# Patient Record
Sex: Female | Born: 1980 | Race: White | Hispanic: No | Marital: Single | Smoking: Former smoker
Health system: Southern US, Community
[De-identification: ages and names within clinical notes are randomized; demographics above are authoritative.]

## PROBLEM LIST (undated history)

## (undated) DIAGNOSIS — R7309 Other abnormal glucose: Secondary | ICD-10-CM

## (undated) DIAGNOSIS — K760 Fatty (change of) liver, not elsewhere classified: Secondary | ICD-10-CM

## (undated) DIAGNOSIS — N946 Dysmenorrhea, unspecified: Secondary | ICD-10-CM

## (undated) DIAGNOSIS — F32A Depression, unspecified: Secondary | ICD-10-CM

## (undated) DIAGNOSIS — E039 Hypothyroidism, unspecified: Secondary | ICD-10-CM

## (undated) DIAGNOSIS — L680 Hirsutism: Secondary | ICD-10-CM

## (undated) DIAGNOSIS — F329 Major depressive disorder, single episode, unspecified: Secondary | ICD-10-CM

## (undated) DIAGNOSIS — R51 Headache: Secondary | ICD-10-CM

## (undated) DIAGNOSIS — E282 Polycystic ovarian syndrome: Secondary | ICD-10-CM

## (undated) DIAGNOSIS — F419 Anxiety disorder, unspecified: Secondary | ICD-10-CM

## (undated) DIAGNOSIS — N939 Abnormal uterine and vaginal bleeding, unspecified: Secondary | ICD-10-CM

## (undated) DIAGNOSIS — Q51818 Other congenital malformations of uterus: Secondary | ICD-10-CM

## (undated) DIAGNOSIS — R519 Headache, unspecified: Secondary | ICD-10-CM

## (undated) HISTORY — DX: Abnormal uterine and vaginal bleeding, unspecified: N93.9

## (undated) HISTORY — DX: Other congenital malformations of uterus: Q51.818

## (undated) HISTORY — PX: NO PAST SURGERIES: SHX2092

## (undated) HISTORY — DX: Dysmenorrhea, unspecified: N94.6

## (undated) HISTORY — DX: Other abnormal glucose: R73.09

## (undated) HISTORY — DX: Hirsutism: L68.0

---

## 2017-11-21 ENCOUNTER — Emergency Department (HOSPITAL_COMMUNITY)
Admission: EM | Admit: 2017-11-21 | Discharge: 2017-11-22 | Disposition: A | Discharge status: Home / Self Care | Payer: Self-pay | Attending: Emergency Medicine | Admitting: Emergency Medicine

## 2017-11-21 ENCOUNTER — Emergency Department (HOSPITAL_COMMUNITY): Payer: Self-pay

## 2017-11-21 ENCOUNTER — Encounter (HOSPITAL_COMMUNITY): Payer: Self-pay | Admitting: Emergency Medicine

## 2017-11-21 DIAGNOSIS — Y929 Unspecified place or not applicable: Secondary | ICD-10-CM | POA: Insufficient documentation

## 2017-11-21 DIAGNOSIS — S8252XA Displaced fracture of medial malleolus of left tibia, initial encounter for closed fracture: Secondary | ICD-10-CM | POA: Insufficient documentation

## 2017-11-21 DIAGNOSIS — R109 Unspecified abdominal pain: Secondary | ICD-10-CM | POA: Insufficient documentation

## 2017-11-21 DIAGNOSIS — R079 Chest pain, unspecified: Secondary | ICD-10-CM | POA: Insufficient documentation

## 2017-11-21 DIAGNOSIS — Q7292 Unspecified reduction defect of left lower limb: Secondary | ICD-10-CM

## 2017-11-21 DIAGNOSIS — Y999 Unspecified external cause status: Secondary | ICD-10-CM | POA: Insufficient documentation

## 2017-11-21 DIAGNOSIS — R102 Pelvic and perineal pain: Secondary | ICD-10-CM | POA: Insufficient documentation

## 2017-11-21 DIAGNOSIS — S82402A Unspecified fracture of shaft of left fibula, initial encounter for closed fracture: Secondary | ICD-10-CM | POA: Insufficient documentation

## 2017-11-21 DIAGNOSIS — Y939 Activity, unspecified: Secondary | ICD-10-CM | POA: Insufficient documentation

## 2017-11-21 DIAGNOSIS — S0990XA Unspecified injury of head, initial encounter: Secondary | ICD-10-CM | POA: Insufficient documentation

## 2017-11-21 DIAGNOSIS — S82892A Other fracture of left lower leg, initial encounter for closed fracture: Secondary | ICD-10-CM

## 2017-11-21 LAB — BASIC METABOLIC PANEL
ANION GAP: 13 (ref 5–15)
BUN: 9 mg/dL (ref 6–20)
CO2: 20 mmol/L — ABNORMAL LOW (ref 22–32)
CREATININE: 0.66 mg/dL (ref 0.44–1.00)
Calcium: 9.6 mg/dL (ref 8.9–10.3)
Chloride: 105 mmol/L (ref 98–111)
Glucose, Bld: 117 mg/dL — ABNORMAL HIGH (ref 70–99)
Potassium: 3.5 mmol/L (ref 3.5–5.1)
Sodium: 138 mmol/L (ref 135–145)

## 2017-11-21 LAB — CBC
HEMATOCRIT: 41.3 % (ref 36.0–46.0)
Hemoglobin: 12.8 g/dL (ref 12.0–15.0)
MCH: 27.3 pg (ref 26.0–34.0)
MCHC: 31 g/dL (ref 30.0–36.0)
MCV: 88.1 fL (ref 78.0–100.0)
PLATELETS: 335 10*3/uL (ref 150–400)
RBC: 4.69 MIL/uL (ref 3.87–5.11)
RDW: 13.8 % (ref 11.5–15.5)
WBC: 12.2 10*3/uL — ABNORMAL HIGH (ref 4.0–10.5)

## 2017-11-21 LAB — I-STAT BETA HCG BLOOD, ED (MC, WL, AP ONLY): I-stat hCG, quantitative: <5 m[IU]/mL (ref <5)

## 2017-11-21 MED ORDER — TETANUS-DIPHTH-ACELL PERTUSSIS 5-2.5-18.5 LF-MCG/0.5 IM SUSP
0.5000 mL | Freq: Once | INTRAMUSCULAR | Status: AC
  Administered 2017-11-21: 0.5 mL via INTRAMUSCULAR
  Filled 2017-11-21: qty 0.5

## 2017-11-21 MED ORDER — SODIUM CHLORIDE 0.9 % IV BOLUS
1000.0000 mL | Freq: Once | INTRAVENOUS | Status: AC
  Administered 2017-11-21: 1000 mL via INTRAVENOUS

## 2017-11-21 MED ORDER — HYDROMORPHONE HCL 1 MG/ML IJ SOLN
1.0000 mg | Freq: Once | INTRAMUSCULAR | Status: AC
  Administered 2017-11-21: 1 mg via INTRAVENOUS
  Filled 2017-11-21: qty 1

## 2017-11-21 MED ORDER — IOHEXOL 300 MG/ML  SOLN
100.0000 mL | Freq: Once | INTRAMUSCULAR | Status: AC | PRN
  Administered 2017-11-21: 100 mL via INTRAVENOUS

## 2017-11-21 NOTE — ED Notes (Signed)
Pt back from imaging

## 2017-11-21 NOTE — ED Notes (Signed)
Patient transported to CT 
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
## 2017-11-21 NOTE — ED Notes (Signed)
Pt back from CT

## 2017-11-21 NOTE — ED Notes (Signed)
Pt transported to imaging.

## 2017-11-22 ENCOUNTER — Emergency Department (HOSPITAL_COMMUNITY): Payer: Self-pay

## 2017-11-22 MED ORDER — PROPOFOL 10 MG/ML IV BOLUS
0.5000 mg/kg | Freq: Once | INTRAVENOUS | Status: DC
  Filled 2017-11-22: qty 20

## 2017-11-22 MED ORDER — KETAMINE HCL 10 MG/ML IJ SOLN
1.0000 mg/kg | Freq: Once | INTRAMUSCULAR | Status: DC
  Filled 2017-11-22: qty 1

## 2017-11-22 MED ORDER — PROPOFOL 10 MG/ML IV BOLUS
INTRAVENOUS | Status: AC | PRN
  Administered 2017-11-22: 25 mg via INTRAVENOUS

## 2017-11-22 MED ORDER — HYDROCODONE-ACETAMINOPHEN 5-325 MG PO TABS: 1.0000 | ORAL_TABLET | Freq: Four times a day (QID) | ORAL | 0 refills | Status: DC | PRN

## 2017-11-22 MED ORDER — KETAMINE HCL 10 MG/ML IJ SOLN
INTRAMUSCULAR | Status: AC | PRN
  Administered 2017-11-22: 50 mg via INTRAVENOUS
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
## 2017-11-22 NOTE — ED Provider Notes (Signed)
  Physical Exam  BP 126/73   Pulse 99   Temp 97.8 F (36.6 C) (Oral)   Resp (!) 21   Ht 5\' 2"  (1.575 m)   Wt 113.4 kg (250 lb)   LMP 10/22/2017 (Approximate)   SpO2 94%   BMI 45.73 kg/m   Physical Exam  Deformed left ankle.  Neurovascularly intact.  Patient is morbidly obese but otherwise no risk factors for sedation.  ED Course/Procedures     .Sedation Date/Time: 11/22/2017 5:02 AM Performed by: Merryl Hacker, MD Authorized by: Merryl Hacker, MD   Consent:    Consent obtained:  Verbal   Consent given by:  Patient   Risks discussed:  Allergic reaction, dysrhythmia, inadequate sedation, nausea, prolonged hypoxia resulting in organ damage, prolonged sedation necessitating reversal, respiratory compromise necessitating ventilatory assistance and intubation and vomiting   Alternatives discussed:  Analgesia without sedation, anxiolysis and regional anesthesia Universal protocol:    Procedure explained and questions answered to patient or proxy's satisfaction: yes     Relevant documents present and verified: yes     Test results available and properly labeled: yes     Imaging studies available: yes     Required blood products, implants, devices, and special equipment available: yes     Site/side marked: yes     Immediately prior to procedure a time out was called: yes     Patient identity confirmation method:  Verbally with patient Indications:    Procedure performed:  Fracture reduction   Procedure necessitating sedation performed by:  Physician performing sedation   Intended level of sedation:  Moderate (conscious sedation) Pre-sedation assessment:    Time since last food or drink:  >4 hrs   ASA classification: class 1 - normal, healthy patient     Neck mobility: normal     Mouth opening:  3 or more finger widths   Thyromental distance:  4 finger widths   Mallampati score:  I - soft palate, uvula, fauces, pillars visible   Pre-sedation assessments completed and  reviewed: airway patency, cardiovascular function, hydration status, mental status, nausea/vomiting, pain level, respiratory function and temperature   Immediate pre-procedure details:    Reassessment: Patient reassessed immediately prior to procedure     Reviewed: vital signs, relevant labs/tests and NPO status     Verified: bag valve mask available, emergency equipment available, intubation equipment available, IV patency confirmed, oxygen available and suction available   Procedure details (see MAR for exact dosages):    Preoxygenation:  Nasal cannula   Sedation:  Propofol and ketamine   Intra-procedure monitoring:  Blood pressure monitoring, cardiac monitor, continuous pulse oximetry, frequent LOC assessments, frequent vital sign checks and continuous capnometry   Intra-procedure events: none     Total Provider sedation time (minutes):  20 Post-procedure details:    Attendance: Constant attendance by certified staff until patient recovered     Recovery: Patient returned to pre-procedure baseline     Post-sedation assessments completed and reviewed: airway patency, cardiovascular function, hydration status, mental status, nausea/vomiting, pain level, respiratory function and temperature     Patient is stable for discharge or admission: yes     Patient tolerance:  Tolerated well, no immediate complications    MDM   Additional note for details       Merryl Hacker, MD 11/22/17 [phone]

## 2017-11-23 ENCOUNTER — Encounter (INDEPENDENT_AMBULATORY_CARE_PROVIDER_SITE_OTHER): Payer: Self-pay | Admitting: Orthopaedic Surgery

## 2017-11-24 ENCOUNTER — Ambulatory Visit (INDEPENDENT_AMBULATORY_CARE_PROVIDER_SITE_OTHER): Payer: Self-pay | Admitting: Orthopedic Surgery

## 2017-11-24 ENCOUNTER — Other Ambulatory Visit: Payer: Self-pay

## 2017-11-24 ENCOUNTER — Encounter (HOSPITAL_COMMUNITY): Payer: Self-pay | Admitting: *Deleted

## 2017-11-24 ENCOUNTER — Other Ambulatory Visit (INDEPENDENT_AMBULATORY_CARE_PROVIDER_SITE_OTHER): Payer: Self-pay | Admitting: Orthopedic Surgery

## 2017-11-24 ENCOUNTER — Encounter (INDEPENDENT_AMBULATORY_CARE_PROVIDER_SITE_OTHER): Payer: Self-pay | Admitting: Orthopedic Surgery

## 2017-11-24 ENCOUNTER — Ambulatory Visit (HOSPITAL_COMMUNITY)
Admission: EM | Admit: 2017-11-24 | Discharge: 2017-11-24 | Disposition: A | Discharge status: Other Acute Inpatient Hospital | Payer: Self-pay | Attending: Family Medicine | Admitting: Family Medicine

## 2017-11-24 DIAGNOSIS — S82892A Other fracture of left lower leg, initial encounter for closed fracture: Secondary | ICD-10-CM

## 2017-11-24 DIAGNOSIS — S82842A Displaced bimalleolar fracture of left lower leg, initial encounter for closed fracture: Secondary | ICD-10-CM

## 2017-11-24 DIAGNOSIS — Z6841 Body Mass Index (BMI) 40.0 and over, adult: Secondary | ICD-10-CM | POA: Insufficient documentation

## 2017-11-24 DIAGNOSIS — S82892D Other fracture of left lower leg, subsequent encounter for closed fracture with routine healing: Secondary | ICD-10-CM

## 2017-11-24 MED ORDER — HYDROCODONE-ACETAMINOPHEN 5-325 MG PO TABS: 1.0000 | ORAL_TABLET | ORAL | 0 refills | Status: DC | PRN
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
## 2017-11-24 NOTE — ED Notes (Signed)
[facility] called and  Dr. Sharol Given wants to see patient in his office now. Dr. Meda Coffee aware.

## 2017-11-24 NOTE — Discharge Instructions (Addendum)
While performing examination Dr. Sharol Given from [facility] orthopedics called and requested for patient to be seen in his office for follow up.  Patient aware and in agreement with this plan.  Leaving by private vehicle to Dr. Jess Barters office with caregiver.

## 2017-11-24 NOTE — Progress Notes (Signed)
Spoke with pt for pre-op call. Pt denies cardiac history, HTN or Diabetes. 
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
## 2017-11-24 NOTE — Telephone Encounter (Signed)
Patient came in and saw Dr. Sharol Given this afternoon. Questions were answered. She is having surgery tomorrow.

## 2017-11-25 ENCOUNTER — Ambulatory Visit (HOSPITAL_COMMUNITY): Payer: Self-pay | Admitting: Anesthesiology

## 2017-11-25 ENCOUNTER — Encounter (HOSPITAL_COMMUNITY): Payer: Self-pay | Admitting: *Deleted

## 2017-11-25 ENCOUNTER — Ambulatory Visit (HOSPITAL_COMMUNITY)
Admission: RE | Admit: 2017-11-25 | Discharge: 2017-11-25 | Disposition: A | Discharge status: Home / Self Care | Payer: Self-pay | Source: Ambulatory Visit | Attending: Orthopedic Surgery | Admitting: Orthopedic Surgery

## 2017-11-25 ENCOUNTER — Encounter (HOSPITAL_COMMUNITY)
Admission: RE | Disposition: A | Discharge status: Home / Self Care | Payer: Self-pay | Source: Ambulatory Visit | Attending: Orthopedic Surgery

## 2017-11-25 DIAGNOSIS — Z87891 Personal history of nicotine dependence: Secondary | ICD-10-CM | POA: Insufficient documentation

## 2017-11-25 DIAGNOSIS — Z6841 Body Mass Index (BMI) 40.0 and over, adult: Secondary | ICD-10-CM | POA: Insufficient documentation

## 2017-11-25 DIAGNOSIS — Z8249 Family history of ischemic heart disease and other diseases of the circulatory system: Secondary | ICD-10-CM | POA: Insufficient documentation

## 2017-11-25 DIAGNOSIS — S93439A Sprain of tibiofibular ligament of unspecified ankle, initial encounter: Secondary | ICD-10-CM

## 2017-11-25 DIAGNOSIS — F419 Anxiety disorder, unspecified: Secondary | ICD-10-CM | POA: Insufficient documentation

## 2017-11-25 DIAGNOSIS — F329 Major depressive disorder, single episode, unspecified: Secondary | ICD-10-CM | POA: Insufficient documentation

## 2017-11-25 DIAGNOSIS — K760 Fatty (change of) liver, not elsewhere classified: Secondary | ICD-10-CM | POA: Insufficient documentation

## 2017-11-25 DIAGNOSIS — S82892A Other fracture of left lower leg, initial encounter for closed fracture: Secondary | ICD-10-CM

## 2017-11-25 DIAGNOSIS — S82842A Displaced bimalleolar fracture of left lower leg, initial encounter for closed fracture: Secondary | ICD-10-CM

## 2017-11-25 DIAGNOSIS — S93432A Sprain of tibiofibular ligament of left ankle, initial encounter: Secondary | ICD-10-CM | POA: Insufficient documentation

## 2017-11-25 HISTORY — DX: Fatty (change of) liver, not elsewhere classified: K76.0

## 2017-11-25 HISTORY — DX: Depression, unspecified: F32.A

## 2017-11-25 HISTORY — DX: Headache, unspecified: R51.9

## 2017-11-25 HISTORY — DX: Major depressive disorder, single episode, unspecified: F32.9

## 2017-11-25 HISTORY — PX: ORIF ANKLE FRACTURE: SHX5408

## 2017-11-25 HISTORY — DX: Anxiety disorder, unspecified: F41.9

## 2017-11-25 HISTORY — DX: Headache: R51

## 2017-11-25 SURGERY — OPEN REDUCTION INTERNAL FIXATION (ORIF) ANKLE FRACTURE
Anesthesia: General | Site: Leg Lower | Laterality: Left

## 2017-11-25 MED ORDER — 0.9 % SODIUM CHLORIDE (POUR BTL) OPTIME
TOPICAL | Status: DC | PRN
  Administered 2017-11-25: 1000 mL

## 2017-11-25 MED ORDER — FENTANYL CITRATE (PF) 100 MCG/2ML IJ SOLN
INTRAMUSCULAR | Status: DC | PRN
  Administered 2017-11-25 (×3): 50 ug via INTRAVENOUS

## 2017-11-25 MED ORDER — ONDANSETRON HCL 4 MG/2ML IJ SOLN
INTRAMUSCULAR | Status: AC
  Filled 2017-11-25: qty 2

## 2017-11-25 MED ORDER — DEXAMETHASONE SODIUM PHOSPHATE 10 MG/ML IJ SOLN
INTRAMUSCULAR | Status: DC | PRN
  Administered 2017-11-25: 10 mg via INTRAVENOUS

## 2017-11-25 MED ORDER — PROPOFOL 10 MG/ML IV BOLUS
INTRAVENOUS | Status: DC | PRN
  Administered 2017-11-25: 200 mg via INTRAVENOUS

## 2017-11-25 MED ORDER — PROPOFOL 10 MG/ML IV BOLUS
INTRAVENOUS | Status: AC
  Filled 2017-11-25: qty 20

## 2017-11-25 MED ORDER — HYDROMORPHONE HCL 1 MG/ML IJ SOLN: 0.2500 mg | INTRAMUSCULAR | Status: DC | PRN

## 2017-11-25 MED ORDER — DEXAMETHASONE SODIUM PHOSPHATE 10 MG/ML IJ SOLN
INTRAMUSCULAR | Status: AC
  Filled 2017-11-25: qty 1

## 2017-11-25 MED ORDER — PHENYLEPHRINE 40 MCG/ML (10ML) SYRINGE FOR IV PUSH (FOR BLOOD PRESSURE SUPPORT)
PREFILLED_SYRINGE | INTRAVENOUS | Status: AC
  Filled 2017-11-25: qty 10

## 2017-11-25 MED ORDER — FENTANYL CITRATE (PF) 250 MCG/5ML IJ SOLN
INTRAMUSCULAR | Status: AC
  Filled 2017-11-25: qty 5

## 2017-11-25 MED ORDER — MIDAZOLAM HCL 2 MG/2ML IJ SOLN
INTRAMUSCULAR | Status: AC
  Filled 2017-11-25: qty 2

## 2017-11-25 MED ORDER — CEFAZOLIN SODIUM-DEXTROSE 2-4 GM/100ML-% IV SOLN
2.0000 g | INTRAVENOUS | Status: AC
  Administered 2017-11-25: 3 g via INTRAVENOUS

## 2017-11-25 MED ORDER — BUPIVACAINE-EPINEPHRINE (PF) 0.5% -1:200000 IJ SOLN
INTRAMUSCULAR | Status: DC | PRN
  Administered 2017-11-25: 30 mL via PERINEURAL

## 2017-11-25 MED ORDER — EPHEDRINE SULFATE 50 MG/ML IJ SOLN
INTRAMUSCULAR | Status: AC
  Filled 2017-11-25: qty 1

## 2017-11-25 MED ORDER — CHLORHEXIDINE GLUCONATE 4 % EX LIQD: 60.0000 mL | Freq: Once | CUTANEOUS | Status: DC

## 2017-11-25 MED ORDER — BUPIVACAINE HCL (PF) 0.5 % IJ SOLN
INTRAMUSCULAR | Status: DC | PRN
  Administered 2017-11-25: 10 mL

## 2017-11-25 MED ORDER — FENTANYL CITRATE (PF) 250 MCG/5ML IJ SOLN
INTRAMUSCULAR | Status: AC
Start: 2017-11-25 — End: ?
  Filled 2017-11-25: qty 5

## 2017-11-25 MED ORDER — ONDANSETRON HCL 4 MG/2ML IJ SOLN
INTRAMUSCULAR | Status: DC | PRN
  Administered 2017-11-25: 4 mg via INTRAVENOUS

## 2017-11-25 MED ORDER — LACTATED RINGERS IV SOLN
INTRAVENOUS | Status: DC
  Administered 2017-11-25: 08:00:00 via INTRAVENOUS

## 2017-11-25 MED ORDER — MIDAZOLAM HCL 2 MG/2ML IJ SOLN
INTRAMUSCULAR | Status: DC | PRN
  Administered 2017-11-25: 2 mg via INTRAVENOUS

## 2017-11-25 MED ORDER — LIDOCAINE 2% (20 MG/ML) 5 ML SYRINGE
INTRAMUSCULAR | Status: AC
  Filled 2017-11-25: qty 5

## 2017-11-25 SURGICAL SUPPLY — 50 items
BANDAGE ESMARK 6X9 LF (GAUZE/BANDAGES/DRESSINGS) ×1 IMPLANT
BENZOIN TINCTURE PRP APPL 2/3 (GAUZE/BANDAGES/DRESSINGS) ×2 IMPLANT
BIT DRILL 2.5X110 QC LCP DISP (BIT) ×2 IMPLANT
BNDG COHESIVE 4X5 TAN STRL (GAUZE/BANDAGES/DRESSINGS) ×2 IMPLANT
BNDG ESMARK 6X9 LF (GAUZE/BANDAGES/DRESSINGS) ×2
BNDG GAUZE ELAST 4 BULKY (GAUZE/BANDAGES/DRESSINGS) IMPLANT
COVER SURGICAL LIGHT HANDLE (MISCELLANEOUS) ×2 IMPLANT
DRAPE INCISE IOBAN 85X60 (DRAPES) ×4 IMPLANT
DRAPE OEC MINIVIEW 54X84 (DRAPES) ×2 IMPLANT
DRAPE U-SHAPE 47X51 STRL (DRAPES) ×2 IMPLANT
DRESSING PREVENA PLUS CUSTOM (GAUZE/BANDAGES/DRESSINGS) ×1 IMPLANT
DRSG ADAPTIC 3X8 NADH LF (GAUZE/BANDAGES/DRESSINGS) IMPLANT
DRSG PAD ABDOMINAL 8X10 ST (GAUZE/BANDAGES/DRESSINGS) IMPLANT
DRSG PREVENA PLUS CUSTOM (GAUZE/BANDAGES/DRESSINGS) ×2
DURAPREP 26ML APPLICATOR (WOUND CARE) ×2 IMPLANT
ELECT REM PT RETURN 9FT ADLT (ELECTROSURGICAL) ×2
ELECTRODE REM PT RTRN 9FT ADLT (ELECTROSURGICAL) ×1 IMPLANT
GAUZE SPONGE 4X4 12PLY STRL (GAUZE/BANDAGES/DRESSINGS) IMPLANT
GLOVE BIOGEL PI IND STRL 9 (GLOVE) ×1 IMPLANT
GLOVE BIOGEL PI INDICATOR 9 (GLOVE) ×1
GLOVE SURG ORTHO 9.0 STRL STRW (GLOVE) ×2 IMPLANT
GOWN STRL REUS W/ TWL XL LVL3 (GOWN DISPOSABLE) ×2 IMPLANT
GOWN STRL REUS W/TWL XL LVL3 (GOWN DISPOSABLE) ×2
K-WIRE 1.25 TRCR POINT 150 (WIRE) ×4
KIT BASIN OR (CUSTOM PROCEDURE TRAY) ×2 IMPLANT
KIT TURNOVER KIT B (KITS) ×2 IMPLANT
KWIRE 1.25 TRCR POINT 150 (WIRE) ×2 IMPLANT
MANIFOLD NEPTUNE II (INSTRUMENTS) IMPLANT
NS IRRIG 1000ML POUR BTL (IV SOLUTION) ×2 IMPLANT
PACK ORTHO EXTREMITY (CUSTOM PROCEDURE TRAY) ×2 IMPLANT
PAD ARMBOARD 7.5X6 YLW CONV (MISCELLANEOUS) ×4 IMPLANT
PLATE LCP 3.5 1/3 TUB 8HX93 (Plate) ×2 IMPLANT
SCREW CANN S THRD/44 4.0 (Screw) ×4 IMPLANT
SCREW CORTEX 3.5 12MM (Screw) ×3 IMPLANT
SCREW CORTEX 3.5 14MM (Screw) ×1 IMPLANT
SCREW CORTEX 3.5 40MM (Screw) ×1 IMPLANT
SCREW LOCK CORT ST 3.5X12 (Screw) ×3 IMPLANT
SCREW LOCK CORT ST 3.5X14 (Screw) ×1 IMPLANT
SCREW LOCK CORT ST 3.5X40 (Screw) ×1 IMPLANT
SCREW LOCK T15 FT 14X3.5X2.9X (Screw) ×1 IMPLANT
SCREW LOCKING 3.5X14 (Screw) ×1 IMPLANT
STAPLER VISISTAT 35W (STAPLE) IMPLANT
SUCTION FRAZIER HANDLE 10FR (MISCELLANEOUS) ×1
SUCTION TUBE FRAZIER 10FR DISP (MISCELLANEOUS) ×1 IMPLANT
SUT ETHILON 2 0 PSLX (SUTURE) ×8 IMPLANT
SUT VIC AB 2-0 CT1 27 (SUTURE) ×1
SUT VIC AB 2-0 CT1 TAPERPNT 27 (SUTURE) ×1 IMPLANT
TOWEL OR 17X24 6PK STRL BLUE (TOWEL DISPOSABLE) ×2 IMPLANT
TOWEL OR 17X26 10 PK STRL BLUE (TOWEL DISPOSABLE) ×2 IMPLANT
TUBE CONNECTING 12X1/4 (SUCTIONS) ×2 IMPLANT

## 2017-11-25 NOTE — Anesthesia Postprocedure Evaluation (Signed)
Anesthesia Post Note  Patient: Nature conservation officer  Procedure(s) Performed: OPEN REDUCTION INTERNAL FIXATION (ORIF) LEFT ANKLE FRACTURE (Left Leg Lower)     Patient location during evaluation: PACU Anesthesia Type: General and Regional Level of consciousness: awake and alert Pain management: pain level controlled Vital Signs Assessment: post-procedure vital signs reviewed and stable Respiratory status: spontaneous breathing, nonlabored ventilation and respiratory function stable Cardiovascular status: blood pressure returned to baseline and stable Postop Assessment: no apparent nausea or vomiting Anesthetic complications: no    Last Vitals:  Vitals:   11/25/17 1145 11/25/17 1200  BP: 103/60 116/75  Pulse: 78 82  Resp: 15 16  Temp:  36.6 C  SpO2: 97% 97%    Last Pain:  Vitals:   11/25/17 1200  TempSrc:   PainSc: 0-No pain                 Cinch Ormond,W. EDMOND

## 2017-11-25 NOTE — Anesthesia Procedure Notes (Signed)
Anesthesia Regional Block: Popliteal block   Pre-Anesthetic Checklist: ,, timeout performed, Correct Patient, Correct Site, Correct Laterality, Correct Procedure, Correct Position, site marked, Risks and benefits discussed, pre-op evaluation,  At surgeon's request and post-op pain management  Laterality: Left  Prep: Maximum Sterile Barrier Precautions used, chloraprep       Needles:  Injection technique: Single-shot  Needle Type: Echogenic Stimulator Needle     Needle Length: 9cm  Needle Gauge: 21     Additional Needles:   Procedures:, nerve stimulator,,, ultrasound used (permanent image in chart),,,,   Nerve Stimulator or Paresthesia:  Response: Peroneal,  Response: Tibial,   Additional Responses:   Narrative:  Start time: 11/25/2017 9:05 AM End time: 11/25/2017 9:20 AM Injection made incrementally with aspirations every 5 mL. Anesthesiologist: Roderic Palau, MD  Additional Notes: 2% Lidocaine skin wheel. Saphenous block with 10cc of 0.5% Bupivicaine plain.
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
## 2017-11-25 NOTE — Op Note (Signed)
11/25/2017  11:07 AM  PATIENT:  Patricia Rios    PRE-OPERATIVE DIAGNOSIS:  Bimalleolar Left Ankle Fracture with syndesmotic disruption  POST-OPERATIVE DIAGNOSIS:  Same  PROCEDURE:  OPEN REDUCTION INTERNAL FIXATION (ORIF) LEFT ANKLE FRACTURE, with ORIF fibula and medial malleolus and internal fixation of the syndesmosis. Application of incisional wound VAC. C arm fluoroscopy.  SURGEON:  Newt Minion, MD  PHYSICIAN ASSISTANT:None ANESTHESIA:   General  PREOPERATIVE INDICATIONS:  Patricia Rios is a  37 y.o. female with a diagnosis of Bimalleolar Left Ankle Fracture who failed conservative measures and elected for surgical management.    The risks benefits and alternatives were discussed with the patient preoperatively including but not limited to the risks of infection, bleeding, nerve injury, cardiopulmonary complications, the need for revision surgery, among others, and the patient was willing to proceed.  OPERATIVE IMPLANTS: 8 hole one third tubular plate with associated internal fixation and 2 cannulated screws for the medial malleolus 140 mm screw for the syndesmotic repair  @ENCIMAGES @  OPERATIVE FINDINGS: Syndesmotic disruption with partial injury to the posterior tibial tendon.  OPERATIVE PROCEDURE: Patient was brought to the operating room and underwent a general anesthetic.  After adequate levels of anesthesia were obtained patient's left lower extremity was prepped using DuraPrep draped in the sterile field a timeout was called.  Patient received a popliteal block preoperatively.  A lateral incision was made over the fibula.  This was carried sharply down to bone.  The fracture margins were freshened and irrigated with normal saline.  There was a large segmental piece and repair and reduction of the fracture was obtained with use of the hardware.  The plate was secured proximally to the fibula 8 hole plate.  The fibula was then sought out to length secured to the plate and a locking  and compression screw were placed distally.  3 compression screws were placed proximally and a syndesmotic screw was placed under C-arm fluoroscopy to repair the syndesmosis.  With the large segmental fragment the soft tissue attachment was not disrupted to maintain viability of the retained fragment.  The wound was irrigated with normal saline incision was closed using 2-0 nylon.  C-arm fluoroscopy verified the fibula was out to length and verified reduction and rotation.  Attention was then focused on the medial malleolus.  A medial incision was made this was carried sharply down to bone.  The soft tissue was cleansed from the fracture site.  The fracture site extended into the joint and the joint was irrigated and debrided.  Examination of the posterior tibial tendon showed some partial injury however over 90% of the posterior tibial tendon was intact.C-arm fluoroscopy verified alignment  The medial malleolus was reduced and stabilized with 2 K wires.  2 cannulated screws were placed over the wires plan.  C-arm fluoroscopy verified a congruent mortise.  The wound was again irrigated incision closed using 2-0 nylon.  A sterile customizable Praveena dressing was applied this had a good suction fit.  Patient was extubated taken the PACU in stable condition.   DISCHARGE PLANNING:  Antibiotic duration: 3 g preoperatively  Weightbearing: Nonweightbearing on the left fracture boot crutches  Pain medication: Vicodin  Dressing care/ Wound VAC: Continue wound VAC for 1 week  Ambulatory devices: Crutches or kneeling scooter.  Discharge to: Home.  Follow-up: In the office 1 week post operative.

## 2017-11-25 NOTE — Progress Notes (Signed)
Orthopedic Tech Progress Note Patient Details:  Patricia Rios 08/08/1980 [id number]  Ortho Devices Type of Ortho Device: CAM walker Ortho Device/Splint Location: lle Ortho Device/Splint Interventions: Application   Post Interventions Patient Tolerated: Well Instructions Provided: Care of device   Hildred Priest 11/25/2017, 11:28 AM

## 2017-11-25 NOTE — Anesthesia Procedure Notes (Signed)
Procedure Name: LMA Insertion Date/Time: 11/25/2017 9:49 AM Performed by: Inda Coke, CRNA Pre-anesthesia Checklist: Patient identified, Emergency Drugs available, Suction available and Patient being monitored Patient Re-evaluated:Patient Re-evaluated prior to induction Oxygen Delivery Method: Circle System Utilized Preoxygenation: Pre-oxygenation with 100% oxygen Induction Type: IV induction Ventilation: Mask ventilation without difficulty LMA: LMA inserted LMA Size: 4.0 Number of attempts: 1 Airway Equipment and Method: Bite block Placement Confirmation: positive ETCO2 Tube secured with: Tape Dental Injury: Teeth and Oropharynx as per pre-operative assessment

## 2017-11-25 NOTE — Transfer of Care (Signed)
Immediate Anesthesia Transfer of Care Note  Patient: Nature conservation officer  Procedure(s) Performed: OPEN REDUCTION INTERNAL FIXATION (ORIF) LEFT ANKLE FRACTURE (Left Leg Lower)  Patient Location: PACU  Anesthesia Type:General  Level of Consciousness: awake and alert   Airway & Oxygen Therapy: Patient Spontanous Breathing and Patient connected to nasal cannula oxygen  Post-op Assessment: Report given to RN, Post -op Vital signs reviewed and stable and Patient moving all extremities X 4  Post vital signs: Reviewed and stable  Last Vitals:  Vitals Value Taken Time  BP    Temp 36.3 C 11/25/2017 11:05 AM  Pulse 80 11/25/2017 11:09 AM  Resp 15 11/25/2017 11:09 AM  SpO2 99 % 11/25/2017 11:09 AM  Vitals shown include unvalidated device data.  Last Pain:  Vitals:   11/25/17 0811  TempSrc: Oral  PainSc:       Patients Stated Pain Goal: 6 (12/22/21 3612)  Complications: No apparent anesthesia complications

## 2017-11-25 NOTE — Anesthesia Preprocedure Evaluation (Addendum)
Anesthesia Evaluation  Patient identified by MRN, date of birth, ID band Patient awake    Reviewed: Allergy & Precautions, H&P , NPO status , Patient's Chart, lab work & pertinent test results  Airway Mallampati: III  TM Distance: >3 FB Neck ROM: Full    Dental no notable dental hx. (+) Teeth Intact, Dental Advisory Given   Pulmonary neg pulmonary ROS, former smoker,    Pulmonary exam normal breath sounds clear to auscultation       Cardiovascular negative cardio ROS   Rhythm:Regular Rate:Normal     Neuro/Psych  Headaches, Anxiety Depression    GI/Hepatic negative GI ROS, Neg liver ROS,   Endo/Other  Morbid obesity  Renal/GU negative Renal ROS  negative genitourinary   Musculoskeletal   Abdominal   Peds  Hematology negative hematology ROS (+)   Anesthesia Other Findings   Reproductive/Obstetrics negative OB ROS                            Anesthesia Physical Anesthesia Plan  ASA: III  Anesthesia Plan: General   Post-op Pain Management:  Regional for Post-op pain   Induction: Intravenous  PONV Risk Score and Plan: 3 and Ondansetron, Dexamethasone and Midazolam  Airway Management Planned: LMA  Additional Equipment:   Intra-op Plan:   Post-operative Plan: Extubation in OR  Informed Consent: I have reviewed the patients History and Physical, chart, labs and discussed the procedure including the risks, benefits and alternatives for the proposed anesthesia with the patient or authorized representative who has indicated his/her understanding and acceptance.   Dental advisory given  Plan Discussed with: CRNA  Anesthesia Plan Comments:         Anesthesia Quick Evaluation

## 2017-11-27 ENCOUNTER — Telehealth (INDEPENDENT_AMBULATORY_CARE_PROVIDER_SITE_OTHER): Payer: Self-pay | Admitting: Orthopedic Surgery

## 2017-11-27 NOTE — Telephone Encounter (Signed)
Patients sister and caretaker is wondering if patient should continue icing her ankle until she comes in for her follow up. Call Levada Dy to advise # [phone]

## 2017-11-27 NOTE — Telephone Encounter (Signed)
I called to advise yes she can continue  to ice and should have foot elevated higher than her heart and not down on the floor. Bring her fx boot with her to her appt on Thursday and can call with any questions.

## 2017-11-28 ENCOUNTER — Encounter (HOSPITAL_COMMUNITY): Payer: Self-pay | Admitting: Orthopedic Surgery

## 2017-11-30 ENCOUNTER — Ambulatory Visit (INDEPENDENT_AMBULATORY_CARE_PROVIDER_SITE_OTHER): Payer: Self-pay | Admitting: Family

## 2017-11-30 ENCOUNTER — Ambulatory Visit (INDEPENDENT_AMBULATORY_CARE_PROVIDER_SITE_OTHER): Payer: Self-pay

## 2017-11-30 ENCOUNTER — Encounter (INDEPENDENT_AMBULATORY_CARE_PROVIDER_SITE_OTHER): Payer: Self-pay | Admitting: Family

## 2017-11-30 VITALS — Ht 63.0 in | Wt 250.0 lb

## 2017-11-30 DIAGNOSIS — M25572 Pain in left ankle and joints of left foot: Secondary | ICD-10-CM

## 2017-11-30 DIAGNOSIS — S82841D Displaced bimalleolar fracture of right lower leg, subsequent encounter for closed fracture with routine healing: Secondary | ICD-10-CM

## 2017-11-30 MED ORDER — HYDROCODONE-ACETAMINOPHEN 5-325 MG PO TABS: 1.0000 | ORAL_TABLET | ORAL | 0 refills | Status: DC | PRN

## 2017-11-30 NOTE — Progress Notes (Signed)
   Post-Op Visit Note   Patient: Patricia Rios           Date of Birth: 1981-01-12           MRN: [id number] Visit Date: 11/30/2017 PCP: Patient, No Pcp Per  Chief Complaint:  Chief Complaint  Patient presents with  . Left Ankle - Routine Post Op    11/25/17 ORIF left ankle fx    HPI:  HPI The patient is a 37 year old woman seen 1 week status post ORIF for bimalleolar ankle fracture on left.  Ortho Exam Incisions are approximated with sutures. Moderate swelling. No dehiscence. Mild serous drainage from lateral incision. No erythema or odor. No sign of infection.  Visit Diagnoses:  1. Pain in left ankle and joints of left foot   2. Closed bimalleolar fracture of right ankle with routine healing, subsequent encounter     Plan: begin daily dial soap cleansing. Dry dressings. Wear CAM. Emphasized elevation for swelling. No weight bearing.  Follow-Up Instructions: Return in about 2 weeks (around 12/14/2017).   Imaging: No results found.  Orders:  Orders Placed This Encounter  Procedures  . XR Ankle Complete Left   Meds ordered this encounter  Medications  . HYDROcodone-acetaminophen (NORCO/VICODIN) 5-325 MG tablet    Sig: Take 1 tablet by mouth every 4 (four) hours as needed.    Dispense:  40 tablet    Refill:  0     PMFS History: Patient Active Problem List   Diagnosis Date Noted  . Bimalleolar ankle fracture, left, closed, initial encounter   . Syndesmotic disruption of ankle, initial encounter   . Ankle fracture, left, closed, initial encounter 11/24/2017  . Body mass index 45.0-49.9, adult ([place]) 11/24/2017  . Morbid obesity ([place]) 11/24/2017   Past Medical History:  Diagnosis Date  . Anxiety   . Depression    in the past  . Fatty liver    found on CT scan  . Headache    migraines - none for 5 years (as of 11/24/17    Family History  Problem Relation Age of Onset  . Heart attack Father     Past Surgical History:  Procedure Laterality Date  . NO PAST  SURGERIES    . ORIF ANKLE FRACTURE Left 11/25/2017   Procedure: OPEN REDUCTION INTERNAL FIXATION (ORIF) LEFT ANKLE FRACTURE;  Surgeon: Newt Minion, MD;  Location: [facility];  Service: Orthopedics;  Laterality: Left;   Social History   Occupational History  . Not on file  Tobacco Use  . Smoking status: Former Research scientist (life sciences)  . Smokeless tobacco: Never Used  . Tobacco comment: as a teenager  Substance and Sexual Activity  . Alcohol use: Yes    Comment: rare  . Drug use: Never  . Sexual activity: Not on file

## 2017-12-01 ENCOUNTER — Encounter (INDEPENDENT_AMBULATORY_CARE_PROVIDER_SITE_OTHER): Payer: Self-pay | Admitting: Family

## 2017-12-05 ENCOUNTER — Telehealth (INDEPENDENT_AMBULATORY_CARE_PROVIDER_SITE_OTHER): Payer: Self-pay | Admitting: Orthopedic Surgery

## 2017-12-05 NOTE — Telephone Encounter (Signed)
Can you please call pt if she is not able to make appt tomorrow ok to wait until next week with Erin as long as she is not having problems.

## 2017-12-05 NOTE — Telephone Encounter (Signed)
Patient called and had to cancel her appointment for tomorrow due to an issue at work.  She has an appointment next week with Junie Panning but does not know if she needs to be seen sooner because she has a stitch that is open.  CB#[phone].  Thank you.

## 2017-12-06 ENCOUNTER — Inpatient Hospital Stay (INDEPENDENT_AMBULATORY_CARE_PROVIDER_SITE_OTHER): Payer: Self-pay | Admitting: Family

## 2017-12-06 ENCOUNTER — Encounter (INDEPENDENT_AMBULATORY_CARE_PROVIDER_SITE_OTHER): Payer: Self-pay | Admitting: Family

## 2017-12-06 ENCOUNTER — Inpatient Hospital Stay (INDEPENDENT_AMBULATORY_CARE_PROVIDER_SITE_OTHER): Payer: Self-pay | Admitting: Orthopaedic Surgery

## 2017-12-06 ENCOUNTER — Ambulatory Visit (INDEPENDENT_AMBULATORY_CARE_PROVIDER_SITE_OTHER): Payer: Self-pay | Admitting: Family

## 2017-12-06 VITALS — Ht 63.0 in | Wt 250.0 lb

## 2017-12-06 DIAGNOSIS — S82842A Displaced bimalleolar fracture of left lower leg, initial encounter for closed fracture: Secondary | ICD-10-CM

## 2017-12-06 NOTE — Telephone Encounter (Signed)
I am so sorry I'm just now seeing this!! I see patient was already seen this morning.

## 2017-12-06 NOTE — Progress Notes (Signed)
   Post-Op Visit Note   Patient: Deliliah Spranger           Date of Birth: 1980/07/04           MRN: [id number] Visit Date: 12/06/2017 PCP: Patient, No Pcp Per  Chief Complaint:  Chief Complaint  Patient presents with  . Left Ankle - Routine Post Op    11/25/17 ORIF left ankle     HPI:  HPI The patient is a 37 year old woman seen 2 weeks status post ORIF for bimalleolar ankle fracture on left.   Ortho Exam Incisions are approximated with sutures. Moderate swelling. No dehiscence.  The lateral incision is well-healed.  The medial incision has a 1 cm length that gaped open about 2 mm there is granulation tissue this is only 1 million.  There is no purulence, no erythema or odor. No sign of infection.  Visit Diagnoses:  1. Bimalleolar ankle fracture, left, closed, initial encounter     Plan: Daily dial soap cleansing. Dry dressings. Wear CAM. Emphasized elevation for swelling. No weight bearing.  Follow-Up Instructions: Return in about 2 weeks (around 12/20/2017).   Imaging: No results found.  Orders:  No orders of the defined types were placed in this encounter.  No orders of the defined types were placed in this encounter.    PMFS History: Patient Active Problem List   Diagnosis Date Noted  . Bimalleolar ankle fracture, left, closed, initial encounter   . Syndesmotic disruption of ankle, initial encounter   . Ankle fracture, left, closed, initial encounter 11/24/2017  . Body mass index 45.0-49.9, adult ([place]) 11/24/2017  . Morbid obesity ([place]) 11/24/2017   Past Medical History:  Diagnosis Date  . Anxiety   . Depression    in the past  . Fatty liver    found on CT scan  . Headache    migraines - none for 5 years (as of 11/24/17    Family History  Problem Relation Age of Onset  . Heart attack Father     Past Surgical History:  Procedure Laterality Date  . NO PAST SURGERIES    . ORIF ANKLE FRACTURE Left 11/25/2017   Procedure: OPEN REDUCTION INTERNAL FIXATION (ORIF)  LEFT ANKLE FRACTURE;  Surgeon: Newt Minion, MD;  Location: [facility];  Service: Orthopedics;  Laterality: Left;   Social History   Occupational History  . Not on file  Tobacco Use  . Smoking status: Former Research scientist (life sciences)  . Smokeless tobacco: Never Used  . Tobacco comment: as a teenager  Substance and Sexual Activity  . Alcohol use: Yes    Comment: rare  . Drug use: Never  . Sexual activity: Not on file

## 2017-12-13 ENCOUNTER — Ambulatory Visit (INDEPENDENT_AMBULATORY_CARE_PROVIDER_SITE_OTHER): Payer: Self-pay

## 2017-12-13 ENCOUNTER — Ambulatory Visit (INDEPENDENT_AMBULATORY_CARE_PROVIDER_SITE_OTHER): Payer: Self-pay | Admitting: Family

## 2017-12-13 ENCOUNTER — Encounter (INDEPENDENT_AMBULATORY_CARE_PROVIDER_SITE_OTHER): Payer: Self-pay | Admitting: Family

## 2017-12-13 DIAGNOSIS — S82841D Displaced bimalleolar fracture of right lower leg, subsequent encounter for closed fracture with routine healing: Secondary | ICD-10-CM

## 2017-12-13 NOTE — Progress Notes (Signed)
   Post-Op Visit Note   Patient: Patricia Rios           Date of Birth: Dec 17, 1980           MRN: [id number] Visit Date: 12/13/2017 PCP: Patient, No Pcp Per  Chief Complaint:  Chief Complaint  Patient presents with  . Left Ankle - Pain, Follow-up    HPI:  HPI The patient is a 37 year old woman seen status post ORIF for bimalleolar ankle fracture on left.   Ortho Exam Incisions are well healed. Sutures in place. Moderate swelling. No dehiscence.  There is no purulence, no erythema or odor. No sign of infection.  Visit Diagnoses:  1. Closed bimalleolar fracture of right ankle with routine healing, subsequent encounter     Plan: sutures harvested. compression hose. Wear CAM. Emphasized elevation for swelling. No weight bearing. Follow up in 2 more weeks anticipate advancing weight bearing at that time.  Follow-Up Instructions: No follow-ups on file.   Imaging: No results found.  Orders:  Orders Placed This Encounter  Procedures  . XR Ankle Complete Left   No orders of the defined types were placed in this encounter.    PMFS History: Patient Active Problem List   Diagnosis Date Noted  . Bimalleolar ankle fracture, left, closed, initial encounter   . Syndesmotic disruption of ankle, initial encounter   . Ankle fracture, left, closed, initial encounter 11/24/2017  . Body mass index 45.0-49.9, adult ([place]) 11/24/2017  . Morbid obesity ([place]) 11/24/2017   Past Medical History:  Diagnosis Date  . Anxiety   . Depression    in the past  . Fatty liver    found on CT scan  . Headache    migraines - none for 5 years (as of 11/24/17    Family History  Problem Relation Age of Onset  . Heart attack Father     Past Surgical History:  Procedure Laterality Date  . NO PAST SURGERIES    . ORIF ANKLE FRACTURE Left 11/25/2017   Procedure: OPEN REDUCTION INTERNAL FIXATION (ORIF) LEFT ANKLE FRACTURE;  Surgeon: Newt Minion, MD;  Location: [place];  Service: Orthopedics;  Laterality:  Left;   Social History   Occupational History  . Not on file  Tobacco Use  . Smoking status: Former Research scientist (life sciences)  . Smokeless tobacco: Never Used  . Tobacco comment: as a teenager  Substance and Sexual Activity  . Alcohol use: Yes    Comment: rare  . Drug use: Never  . Sexual activity: Not on file

## 2018-01-03 ENCOUNTER — Encounter (INDEPENDENT_AMBULATORY_CARE_PROVIDER_SITE_OTHER): Payer: Self-pay | Admitting: Family

## 2018-01-03 ENCOUNTER — Ambulatory Visit (INDEPENDENT_AMBULATORY_CARE_PROVIDER_SITE_OTHER): Payer: 59 | Admitting: Family

## 2018-01-03 ENCOUNTER — Ambulatory Visit (INDEPENDENT_AMBULATORY_CARE_PROVIDER_SITE_OTHER): Payer: Self-pay

## 2018-01-03 VITALS — Ht 63.0 in | Wt 250.0 lb

## 2018-01-03 DIAGNOSIS — S82842D Displaced bimalleolar fracture of left lower leg, subsequent encounter for closed fracture with routine healing: Secondary | ICD-10-CM | POA: Diagnosis not present

## 2018-01-03 NOTE — Progress Notes (Signed)
   Post-Op Visit Note   Patient: Patricia Rios           Date of Birth: 07-02-1980           MRN: [id number] Visit Date: 01/03/2018 PCP: Patient, No Pcp Per  Chief Complaint:  Chief Complaint  Patient presents with  . Left Ankle - Follow-up    HPI:  HPI The patient is a 37 year old woman seen status post ORIF for bimalleolar ankle fracture on left. Is six weeks out. Has been nonweight bearing with crutches and cam.  Ortho Exam Incisions are well healed. Minimal swelling. No dehiscence.  There is no purulence, no erythema or odor. No sign of infection.  Visit Diagnoses:  1. Bimalleolar ankle fracture, left, closed, with routine healing, subsequent encounter     Plan: may begin full weight bearing in CAM. When comfortable may transition out of cam in to aso.  Follow up in 2 more weeks anticipate advancing weight bearing at that time.  Follow-Up Instructions: Return in about 4 weeks (around 01/31/2018).   Imaging: Xr Ankle Complete Left  Result Date: 01/03/2018 Radiographs of the left ankle show stable alignment of hardware. Mortise is congruent. No complicating features.    Orders:  Orders Placed This Encounter  Procedures  . XR Ankle Complete Left   No orders of the defined types were placed in this encounter.    PMFS History: Patient Active Problem List   Diagnosis Date Noted  . Bimalleolar ankle fracture, left, closed, initial encounter   . Syndesmotic disruption of ankle, initial encounter   . Ankle fracture, left, closed, initial encounter 11/24/2017  . Body mass index 45.0-49.9, adult ([place]) 11/24/2017  . Morbid obesity ([place]) 11/24/2017   Past Medical History:  Diagnosis Date  . Anxiety   . Depression    in the past  . Fatty liver    found on CT scan  . Headache    migraines - none for 5 years (as of 11/24/17    Family History  Problem Relation Age of Onset  . Heart attack Father     Past Surgical History:  Procedure Laterality Date  . NO PAST  SURGERIES    . ORIF ANKLE FRACTURE Left 11/25/2017   Procedure: OPEN REDUCTION INTERNAL FIXATION (ORIF) LEFT ANKLE FRACTURE;  Surgeon: Newt Minion, MD;  Location: [place];  Service: Orthopedics;  Laterality: Left;   Social History   Occupational History  . Not on file  Tobacco Use  . Smoking status: Former Research scientist (life sciences)  . Smokeless tobacco: Never Used  . Tobacco comment: as a teenager  Substance and Sexual Activity  . Alcohol use: Yes    Comment: rare  . Drug use: Never  . Sexual activity: Not on file

## 2018-01-16 ENCOUNTER — Encounter (INDEPENDENT_AMBULATORY_CARE_PROVIDER_SITE_OTHER): Payer: Self-pay

## 2018-01-20 ENCOUNTER — Encounter (INDEPENDENT_AMBULATORY_CARE_PROVIDER_SITE_OTHER): Payer: Self-pay

## 2018-02-01 ENCOUNTER — Encounter (INDEPENDENT_AMBULATORY_CARE_PROVIDER_SITE_OTHER): Payer: Self-pay | Admitting: Orthopedic Surgery

## 2018-02-01 ENCOUNTER — Ambulatory Visit (INDEPENDENT_AMBULATORY_CARE_PROVIDER_SITE_OTHER): Payer: 59 | Admitting: Orthopedic Surgery

## 2018-02-01 VITALS — Ht 63.0 in | Wt 250.0 lb

## 2018-02-01 DIAGNOSIS — S82842D Displaced bimalleolar fracture of left lower leg, subsequent encounter for closed fracture with routine healing: Secondary | ICD-10-CM

## 2018-02-01 NOTE — Progress Notes (Signed)
   Office Visit Note   Patient: Patricia Rios           Date of Birth: Sep 20, 1980           MRN: [id number] Visit Date: 02/01/2018              Requested by: No referring provider defined for this encounter. PCP: Patient, No Pcp Per  Chief Complaint  Patient presents with  . Left Ankle - Follow-up      HPI: Patient is a 37 year old woman who presents 2 months status post ORIF bimalleolar left ankle fracture she has no complaints she states the swelling is decreasing and the pain is improving.  Assessment & Plan: Visit Diagnoses:  1. Bimalleolar ankle fracture, left, closed, with routine healing, subsequent encounter     Plan: Continue with range of motion exercises increase her activities as tolerated she has no restrictions.  Follow-Up Instructions: Return if symptoms worsen or fail to improve.   Ortho Exam  Patient is alert, oriented, no adenopathy, well-dressed, normal affect, normal respiratory effort. Examination patient has good range of motion of the ankle are well-healed there is no redness no cellulitis no signs of infection she has minimal swelling.  Imaging: No results found. No images are attached to the encounter.  Labs: No results found for: HGBA1C, ESRSEDRATE, CRP, LABURIC, REPTSTATUS, GRAMSTAIN, CULT, LABORGA   No results found for: ALBUMIN, PREALBUMIN, LABURIC  Body mass index is 44.29 kg/m.  Orders:  No orders of the defined types were placed in this encounter.  No orders of the defined types were placed in this encounter.    Procedures: No procedures performed  Clinical Data: No additional findings.  ROS:  All other systems negative, except as noted in the HPI. Review of Systems  Objective: Vital Signs: Ht 5\' 3"  (1.6 m)   Wt 250 lb (113.4 kg)   BMI 44.29 kg/m   Specialty Comments:  No specialty comments available.  PMFS History: Patient Active Problem List   Diagnosis Date Noted  . Bimalleolar ankle fracture, left, closed,  initial encounter   . Syndesmotic disruption of ankle, initial encounter   . Ankle fracture, left, closed, initial encounter 11/24/2017  . Body mass index 45.0-49.9, adult ([place]) 11/24/2017  . Morbid obesity ([place]) 11/24/2017   Past Medical History:  Diagnosis Date  . Anxiety   . Depression    in the past  . Fatty liver    found on CT scan  . Headache    migraines - none for 5 years (as of 11/24/17    Family History  Problem Relation Age of Onset  . Heart attack Father     Past Surgical History:  Procedure Laterality Date  . NO PAST SURGERIES    . ORIF ANKLE FRACTURE Left 11/25/2017   Procedure: OPEN REDUCTION INTERNAL FIXATION (ORIF) LEFT ANKLE FRACTURE;  Surgeon: Newt Minion, MD;  Location: [place];  Service: Orthopedics;  Laterality: Left;   Social History   Occupational History  . Not on file  Tobacco Use  . Smoking status: Former Research scientist (life sciences)  . Smokeless tobacco: Never Used  . Tobacco comment: as a teenager  Substance and Sexual Activity  . Alcohol use: Yes    Comment: rare  . Drug use: Never  . Sexual activity: Not on file

## 2018-03-10 ENCOUNTER — Encounter (INDEPENDENT_AMBULATORY_CARE_PROVIDER_SITE_OTHER): Payer: Self-pay | Admitting: Orthopedic Surgery

## 2018-03-12 ENCOUNTER — Ambulatory Visit (INDEPENDENT_AMBULATORY_CARE_PROVIDER_SITE_OTHER): Payer: Self-pay

## 2018-03-12 ENCOUNTER — Encounter (INDEPENDENT_AMBULATORY_CARE_PROVIDER_SITE_OTHER): Payer: Self-pay | Admitting: Orthopedic Surgery

## 2018-03-12 ENCOUNTER — Ambulatory Visit (INDEPENDENT_AMBULATORY_CARE_PROVIDER_SITE_OTHER): Payer: 59 | Admitting: Physician Assistant

## 2018-03-12 VITALS — Ht 63.0 in | Wt 250.0 lb

## 2018-03-12 DIAGNOSIS — M25572 Pain in left ankle and joints of left foot: Secondary | ICD-10-CM | POA: Diagnosis not present

## 2018-03-12 DIAGNOSIS — S82841D Displaced bimalleolar fracture of right lower leg, subsequent encounter for closed fracture with routine healing: Secondary | ICD-10-CM

## 2018-03-13 ENCOUNTER — Encounter (INDEPENDENT_AMBULATORY_CARE_PROVIDER_SITE_OTHER): Payer: Self-pay | Admitting: Physician Assistant

## 2018-03-13 DIAGNOSIS — M25572 Pain in left ankle and joints of left foot: Secondary | ICD-10-CM | POA: Diagnosis not present

## 2018-03-13 MED ORDER — LIDOCAINE HCL 1 % IJ SOLN
2.0000 mL | INTRAMUSCULAR | Status: AC | PRN
  Administered 2018-03-13: 2 mL

## 2018-03-13 MED ORDER — METHYLPREDNISOLONE ACETATE 40 MG/ML IJ SUSP
40.0000 mg | INTRAMUSCULAR | Status: AC | PRN
  Administered 2018-03-13: 40 mg via INTRA_ARTICULAR

## 2018-03-13 NOTE — Progress Notes (Addendum)
Office Visit Note   Patient: Patricia Rios           Date of Birth: May 28, 1980           MRN: [id number] Visit Date: 03/12/2018              Requested by: No referring provider defined for this encounter. PCP: Patient, No Pcp Per  Chief Complaint  Patient presents with  . Left Ankle - Follow-up    11/25/17 left ankle bimal ORIF      HPI: The patient is a 37 yo female who is seen for post operative follow up following ORIF of a bimalleolar ankle fracture 11/25/2017. She reports intermittent swelling about the lateral incision proximally and pain with the swelling. She is wearing a regular shoe. She has used ice, compression and this does help somewhat. She has not noticed any redness over the areas.   Assessment & Plan: Visit Diagnoses:  1. Closed bimalleolar fracture of right ankle with routine healing, subsequent encounter   2. Pain in left ankle and joints of left foot     Plan: After informed consent the right ankle joint was injected with lidocaine and Depo Medrol under sterile techniques and the patient tolerated the procedure well. She will continue activities to tolerance. Follow up in 4 weeks.   Follow-Up Instructions: Return in about 4 weeks (around 04/09/2018).   Ortho Exam  Patient is alert, oriented, no adenopathy, well-dressed, normal affect, normal respiratory effort. The right ankle incisions are well healed. No signs of cellulitis or infection. Minimal edema about the ankle and distal calf. Good ROM about ankle and foot. Neurovascular intact.   Imaging: Xr Ankle Complete Left  Result Date: 03/13/2018 Good callus formation of fractures, good alignment of ankle mortise, syndesmosis screw broken as expected. No evidence of lucency around the hardware.   No images are attached to the encounter.  Labs: No results found for: HGBA1C, ESRSEDRATE, CRP, LABURIC, REPTSTATUS, GRAMSTAIN, CULT, LABORGA   No results found for: ALBUMIN, PREALBUMIN, LABURIC  Body mass  index is 44.29 kg/m.  Orders:  Orders Placed This Encounter  Procedures  . XR Ankle Complete Left   No orders of the defined types were placed in this encounter.    Procedures: Medium Joint Inj on 03/13/2018 8:46 AM Indications: pain and diagnostic evaluation Details: 22 G 1.5 in needle, anterolateral approach Medications: 2 mL lidocaine 1 %; 40 mg methylPREDNISolone acetate 40 MG/ML Outcome: tolerated well, no immediate complications Procedure, treatment alternatives, risks and benefits explained, specific risks discussed. Consent was given by the patient. Immediately prior to procedure a time out was called to verify the correct patient, procedure, equipment, support staff and site/side marked as required. Patient was prepped and draped in the usual sterile fashion.      Clinical Data: No additional findings.  ROS:  All other systems negative, except as noted in the HPI. Review of Systems  Objective: Vital Signs: Ht 5\' 3"  (1.6 m)   Wt 250 lb (113.4 kg)   BMI 44.29 kg/m   Specialty Comments:  No specialty comments available.  PMFS History: Patient Active Problem List   Diagnosis Date Noted  . Bimalleolar ankle fracture, left, closed, initial encounter   . Syndesmotic disruption of ankle, initial encounter   . Ankle fracture, left, closed, initial encounter 11/24/2017  . Body mass index 45.0-49.9, adult ([place]) 11/24/2017  . Morbid obesity ([place]) 11/24/2017   Past Medical History:  Diagnosis Date  . Anxiety   .  Depression    in the past  . Fatty liver    found on CT scan  . Headache    migraines - none for 5 years (as of 11/24/17    Family History  Problem Relation Age of Onset  . Heart attack Father     Past Surgical History:  Procedure Laterality Date  . NO PAST SURGERIES    . ORIF ANKLE FRACTURE Left 11/25/2017   Procedure: OPEN REDUCTION INTERNAL FIXATION (ORIF) LEFT ANKLE FRACTURE;  Surgeon: Newt Minion, MD;  Location: [place];  Service: Orthopedics;   Laterality: Left;   Social History   Occupational History  . Not on file  Tobacco Use  . Smoking status: Former Research scientist (life sciences)  . Smokeless tobacco: Never Used  . Tobacco comment: as a teenager  Substance and Sexual Activity  . Alcohol use: Yes    Comment: rare  . Drug use: Never  . Sexual activity: Not on file

## 2018-04-09 ENCOUNTER — Ambulatory Visit (INDEPENDENT_AMBULATORY_CARE_PROVIDER_SITE_OTHER): Payer: 59 | Admitting: Orthopedic Surgery

## 2018-12-19 ENCOUNTER — Encounter (INDEPENDENT_AMBULATORY_CARE_PROVIDER_SITE_OTHER): Payer: Self-pay

## 2018-12-19 ENCOUNTER — Telehealth: Payer: Self-pay | Admitting: Orthopedic Surgery

## 2018-12-19 NOTE — Telephone Encounter (Signed)
Called patient left message to return call to schedule an appointment with Dr Sharol Given  for her ankle

## 2018-12-21 ENCOUNTER — Ambulatory Visit: Payer: 59 | Admitting: Family

## 2018-12-24 ENCOUNTER — Ambulatory Visit: Payer: 59 | Admitting: Orthopedic Surgery

## 2018-12-25 ENCOUNTER — Ambulatory Visit (INDEPENDENT_AMBULATORY_CARE_PROVIDER_SITE_OTHER): Payer: 59 | Admitting: Orthopedic Surgery

## 2018-12-25 ENCOUNTER — Encounter: Payer: Self-pay | Admitting: Orthopedic Surgery

## 2018-12-25 ENCOUNTER — Ambulatory Visit: Payer: Self-pay

## 2018-12-25 VITALS — Ht 63.0 in | Wt 250.0 lb

## 2018-12-25 DIAGNOSIS — M25872 Other specified joint disorders, left ankle and foot: Secondary | ICD-10-CM | POA: Diagnosis not present

## 2018-12-25 DIAGNOSIS — S82841D Displaced bimalleolar fracture of right lower leg, subsequent encounter for closed fracture with routine healing: Secondary | ICD-10-CM | POA: Diagnosis not present

## 2018-12-25 MED ORDER — METHYLPREDNISOLONE ACETATE 40 MG/ML IJ SUSP
40.0000 mg | INTRAMUSCULAR | Status: AC | PRN
  Administered 2018-12-25: 40 mg via INTRA_ARTICULAR

## 2018-12-25 MED ORDER — LIDOCAINE HCL 1 % IJ SOLN
2.0000 mL | INTRAMUSCULAR | Status: AC | PRN
  Administered 2018-12-25: 2 mL

## 2018-12-25 NOTE — Progress Notes (Signed)
Office Visit Note   Patient: Patricia Rios           Date of Birth: April 23, 1981           MRN: [id number] Visit Date: 12/25/2018              Requested by: No referring provider defined for this encounter. PCP: Patient, No Pcp Per  Chief Complaint  Patient presents with  . Left Ankle - Follow-up    11/25/2017 left ankle ORIF bimal ankle fx S/p injection 03/12/18      HPI: Patient is a 38 year old woman who presents 1 year status post open reduction internal fixation for trimalleolar fracture with syndesmotic injury.  Patient did have an injection in October of last year which provided some temporary relief.  Patient complains of pain primarily anteriorly over the ankle joint line.  Pain is worse with activities she has tried compression socks she states that the pain wakes her up at night.  Assessment & Plan: Visit Diagnoses:  1. Closed bimalleolar fracture of right ankle with routine healing, subsequent encounter   2. Impingement syndrome of left ankle     Plan: Plan: Her ankle was injected she tolerated this well discussed that if this injection does not relieve her symptoms would recommend proceeding with arthroscopic debridement for the impingement symptoms.  Follow-Up Instructions: Return if symptoms worsen or fail to improve.   Ortho Exam  Patient is alert, oriented, no adenopathy, well-dressed, normal affect, normal respiratory effort. Examination patient does have swelling of the left lower extremity she has a palpable pulse she is maximally tender to palpation anteriorly over the ankle joint including the medial and lateral gutters.  The peroneal and posterior tibial tendons are nontender to palpation the Achilles tendon is nontender to palpation she has dorsiflexion to neutral.  Her foot is plantigrade no plantar ulcers no venous ulcers.  Imaging: Xr Ankle Complete Left  Result Date: 12/25/2018 3 view radiographs of the left ankle shows a congruent mortise calcification  of the syndesmosis with fracture through the syndesmotic screw no other hardware complications.  No images are attached to the encounter.  Labs: No results found for: HGBA1C, ESRSEDRATE, CRP, LABURIC, REPTSTATUS, GRAMSTAIN, CULT, LABORGA   No results found for: ALBUMIN, PREALBUMIN, LABURIC  No results found for: MG No results found for: VD25OH  No results found for: PREALBUMIN CBC EXTENDED Latest Ref Rng & Units 11/21/2017  WBC 4.0 - 10.5 K/uL 12.2(H)  RBC 3.87 - 5.11 MIL/uL 4.69  HGB 12.0 - 15.0 g/dL 12.8  HCT 36.0 - 46.0 % 41.3  PLT 150 - 400 K/uL 335     Body mass index is 44.29 kg/m.  Orders:  Orders Placed This Encounter  Procedures  . XR Ankle Complete Left   No orders of the defined types were placed in this encounter.    Procedures: Medium Joint Inj: L ankle on 12/25/2018 4:03 PM Indications: pain and diagnostic evaluation Details: 22 G 1.5 in needle, anteromedial approach Medications: 2 mL lidocaine 1 %; 40 mg methylPREDNISolone acetate 40 MG/ML Outcome: tolerated well, no immediate complications Procedure, treatment alternatives, risks and benefits explained, specific risks discussed. Consent was given by the patient. Immediately prior to procedure a time out was called to verify the correct patient, procedure, equipment, support staff and site/side marked as required. Patient was prepped and draped in the usual sterile fashion.      Clinical Data: No additional findings.  ROS:  All other systems negative, except as noted  in the HPI. Review of Systems  Objective: Vital Signs: Ht 5\' 3"  (1.6 m)   Wt 250 lb (113.4 kg)   BMI 44.29 kg/m   Specialty Comments:  No specialty comments available.  PMFS History: Patient Active Problem List   Diagnosis Date Noted  . Bimalleolar ankle fracture, left, closed, initial encounter   . Syndesmotic disruption of ankle, initial encounter   . Ankle fracture, left, closed, initial encounter 11/24/2017  . Body mass  index 45.0-49.9, adult ([place]) 11/24/2017  . Morbid obesity ([place]) 11/24/2017   Past Medical History:  Diagnosis Date  . Anxiety   . Depression    in the past  . Fatty liver    found on CT scan  . Headache    migraines - none for 5 years (as of 11/24/17    Family History  Problem Relation Age of Onset  . Heart attack Father     Past Surgical History:  Procedure Laterality Date  . NO PAST SURGERIES    . ORIF ANKLE FRACTURE Left 11/25/2017   Procedure: OPEN REDUCTION INTERNAL FIXATION (ORIF) LEFT ANKLE FRACTURE;  Surgeon: Newt Minion, MD;  Location: [facility];  Service: Orthopedics;  Laterality: Left;   Social History   Occupational History  . Not on file  Tobacco Use  . Smoking status: Former Research scientist (life sciences)  . Smokeless tobacco: Never Used  . Tobacco comment: as a teenager  Substance and Sexual Activity  . Alcohol use: Yes    Comment: rare  . Drug use: Never  . Sexual activity: Not on file

## 2019-02-26 ENCOUNTER — Encounter: Payer: Self-pay | Admitting: Orthopedic Surgery

## 2019-03-04 ENCOUNTER — Other Ambulatory Visit: Payer: Self-pay | Admitting: *Deleted

## 2019-03-04 DIAGNOSIS — Z20822 Contact with and (suspected) exposure to covid-19: Secondary | ICD-10-CM

## 2019-03-05 LAB — NOVEL CORONAVIRUS, NAA: SARS-CoV-2, NAA: NOT DETECTED

## 2019-05-24 DIAGNOSIS — D219 Benign neoplasm of connective and other soft tissue, unspecified: Secondary | ICD-10-CM

## 2019-05-24 HISTORY — DX: Benign neoplasm of connective and other soft tissue, unspecified: D21.9

## 2019-05-27 ENCOUNTER — Ambulatory Visit: Payer: Self-pay | Attending: Internal Medicine

## 2019-05-27 DIAGNOSIS — Z20822 Contact with and (suspected) exposure to covid-19: Secondary | ICD-10-CM

## 2019-05-28 LAB — NOVEL CORONAVIRUS, NAA: SARS-CoV-2, NAA: NOT DETECTED

## 2019-12-23 ENCOUNTER — Other Ambulatory Visit: Payer: Self-pay

## 2019-12-23 ENCOUNTER — Ambulatory Visit (INDEPENDENT_AMBULATORY_CARE_PROVIDER_SITE_OTHER): Payer: BC Managed Care – PPO | Admitting: Obstetrics and Gynecology

## 2019-12-23 ENCOUNTER — Telehealth: Payer: Self-pay | Admitting: Obstetrics and Gynecology

## 2019-12-23 ENCOUNTER — Other Ambulatory Visit (HOSPITAL_COMMUNITY)
Admission: RE | Admit: 2019-12-23 | Discharge: 2019-12-23 | Disposition: A | Discharge status: Home / Self Care | Payer: BC Managed Care – PPO | Source: Ambulatory Visit | Attending: Obstetrics and Gynecology | Admitting: Obstetrics and Gynecology

## 2019-12-23 ENCOUNTER — Encounter: Payer: Self-pay | Admitting: Obstetrics and Gynecology

## 2019-12-23 VITALS — BP 126/88 | HR 84 | Resp 18 | Ht 63.0 in | Wt 270.4 lb

## 2019-12-23 DIAGNOSIS — Z01419 Encounter for gynecological examination (general) (routine) without abnormal findings: Secondary | ICD-10-CM

## 2019-12-23 DIAGNOSIS — N926 Irregular menstruation, unspecified: Secondary | ICD-10-CM

## 2019-12-23 DIAGNOSIS — N921 Excessive and frequent menstruation with irregular cycle: Secondary | ICD-10-CM

## 2019-12-23 DIAGNOSIS — R946 Abnormal results of thyroid function studies: Secondary | ICD-10-CM | POA: Diagnosis not present

## 2019-12-23 NOTE — Patient Instructions (Addendum)
EXERCISE AND DIET:  We recommended that you start or continue a regular exercise program for good health. Regular exercise means any activity that makes HPV (Human Papillomavirus) Vaccine: What You Need to Know 1. Why get vaccinated? HPV (Human papillomavirus) vaccine can prevent infection with some types of human papillomavirus. HPV infections can cause certain types of cancers including:  cervical, vaginal and vulvar cancers in women,  penile cancer in men, and  anal cancers in both men and women. HPV vaccine prevents infection from the HPV types that cause over 90% of these cancers. HPV is spread through intimate skin-to-skin or sexual contact. HPV infections are so common that nearly all men and women will get at least one type of HPV at some time in their lives. Most HPV infections go away by themselves within 2 years. But sometimes HPV infections will last longer and can cause cancers later in life. 2. HPV vaccine HPV vaccine is routinely recommended for adolescents at 64 or 38 years of age to ensure they are protected before they are exposed to the virus. HPV vaccine may be given beginning at age 41 years, and as late as age 61 years. Most people older than 26 years will not benefit from HPV vaccination. Talk with your health care provider if you want more information. Most children who get the first dose before 31 years of age need 2 doses of HPV vaccine. Anyone who gets the first dose on or after 37 years of age, and younger people with certain immunocompromising conditions, need 3 doses. Your health care provider can give you more information. HPV vaccine may be given at the same time as other vaccines. 3. Talk with your health care provider Tell your vaccine provider if the person getting the vaccine:  Has had an allergic reaction after a previous dose of HPV vaccine, or has any severe, life-threatening allergies.  Is pregnant. In some cases, your health care provider may decide to  postpone HPV vaccination to a future visit. People with minor illnesses, such as a cold, may be vaccinated. People who are moderately or severely ill should usually wait until they recover before getting HPV vaccine. Your health care provider can give you more information. 4. Risks of a vaccine reaction  Soreness, redness, or swelling where the shot is given can happen after HPV vaccine.  Fever or headache can happen after HPV vaccine. People sometimes faint after medical procedures, including vaccination. Tell your provider if you feel dizzy or have vision changes or ringing in the ears. As with any medicine, there is a very remote chance of a vaccine causing a severe allergic reaction, other serious injury, or death. 5. What if there is a serious problem? An allergic reaction could occur after the vaccinated person leaves the clinic. If you see signs of a severe allergic reaction (hives, swelling of the face and throat, difficulty breathing, a fast heartbeat, dizziness, or weakness), call 9-1-1 and get the person to the nearest hospital. For other signs that concern you, call your health care provider. Adverse reactions should be reported to the Vaccine Adverse Event Reporting System (VAERS). Your health care provider will usually file this report, or you can do it yourself. Visit the VAERS website at www.vaers.SamedayNews.es or call 914-149-7139. VAERS is only for reporting reactions, and VAERS staff do not give medical advice. 6. The National Vaccine Injury Compensation Program The National Vaccine Injury Compensation Program (VICP) is a federal program that was created to compensate people who may have  been injured by certain vaccines. Visit the VICP website at GoldCloset.com.ee or call (484) 416-4972 to learn about the program and about filing a claim. There is a time limit to file a claim for compensation. 7. How can I learn more?  Ask your health care provider.  Call your local  or state health department.  Contact the Centers for Disease Control and Prevention (CDC): ? Call (408)141-3314 (1-800-CDC-INFO) or ? Visit CDC's website at http://hunter.com/ Vaccine Information Statement HPV Vaccine (03/21/2018) This information is not intended to replace advice given to you by your health care provider. Make sure you discuss any questions you have with your health care provider. Document Revised: 08/28/2018 Document Reviewed: 12/19/2017 Elsevier Patient Education  2020 [employer]. your heart beat faster and makes you sweat.  We recommend exercising at least 30 minutes per day at least 3 days a week, preferably 4 or 5.  We also recommend a diet low in fat and sugar.  Inactivity, poor dietary choices and obesity can cause diabetes, heart attack, stroke, and kidney damage, among others.    ALCOHOL AND SMOKING:  Women should limit their alcohol intake to no more than 7 drinks/beers/glasses of wine (combined, not each!) per week. Moderation of alcohol intake to this level decreases your risk of breast cancer and liver damage. And of course, no recreational drugs are part of a healthy lifestyle.  And absolutely no smoking or even second hand smoke. Most people know smoking can cause heart and lung diseases, but did you know it also contributes to weakening of your bones? Aging of your skin?  Yellowing of your teeth and nails?  CALCIUM AND VITAMIN D:  Adequate intake of calcium and Vitamin D are recommended.  The recommendations for exact amounts of these supplements seem to change often, but generally speaking 600 mg of calcium (either carbonate or citrate) and 800 units of Vitamin D per day seems prudent. Certain women may benefit from higher intake of Vitamin D.  If you are among these women, your doctor will have told you during your visit.    PAP SMEARS:  Pap smears, to check for cervical cancer or precancers,  have traditionally been done yearly, although recent scientific  advances have shown that most women can have pap smears less often.  However, every woman still should have a physical exam from her gynecologist every year. It will include a breast check, inspection of the vulva and vagina to check for abnormal growths or skin changes, a visual exam of the cervix, and then an exam to evaluate the size and shape of the uterus and ovaries.  And after 38 years of age, a rectal exam is indicated to check for rectal cancers. We will also provide age appropriate advice regarding health maintenance, like when you should have certain vaccines, screening for sexually transmitted diseases, bone density testing, colonoscopy, mammograms, etc.   MAMMOGRAMS:  All women over 79 years old should have a yearly mammogram. Many facilities now offer a "3D" mammogram, which may cost around $50 extra out of pocket. If possible,  we recommend you accept the option to have the 3D mammogram performed.  It both reduces the number of women who will be called back for extra views which then turn out to be normal, and it is better than the routine mammogram at detecting truly abnormal areas.    COLONOSCOPY:  Colonoscopy to screen for colon cancer is recommended for all women at age 43.  We know, you hate the idea  of the prep.  We agree, BUT, having colon cancer and not knowing it is worse!!  Colon cancer so often starts as a polyp that can be seen and removed at colonscopy, which can quite literally save your life!  And if your first colonoscopy is normal and you have no family history of colon cancer, most women don't have to have it again for 10 years.  Once every ten years, you can do something that may end up saving your life, right?  We will be happy to help you get it scheduled when you are ready.  Be sure to check your insurance coverage so you understand how much it will cost.  It may be covered as a preventative service at no cost, but you should check your particular policy.        Polycystic Ovarian Syndrome  Polycystic ovarian syndrome (PCOS) is a common hormonal disorder among women of reproductive age. In most women with PCOS, many small fluid-filled sacs (cysts) grow on the ovaries, and the cysts are not part of a normal menstrual cycle. PCOS can cause problems with your menstrual periods and make it difficult to get pregnant. It can also cause an increased risk of miscarriage with pregnancy. If it is not treated, PCOS can lead to serious health problems, such as diabetes and heart disease. What are the causes? The cause of PCOS is not known, but it may be the result of a combination of certain factors, such as:  Irregular menstrual cycle.  High levels of certain hormones (androgens).  Problems with the hormone that helps to control blood sugar (insulin resistance).  Certain genes. What increases the risk? This condition is more likely to develop in women who have a family history of PCOS. What are the signs or symptoms? Symptoms of PCOS may include:  Multiple ovarian cysts.  Infrequent periods or no periods.  Periods that are too frequent or too heavy.  Unpredictable periods.  Inability to get pregnant (infertility) because of not ovulating.  Increased growth of hair on the face, chest, stomach, back, thumbs, thighs, or toes.  Acne or oily skin. Acne may develop during adulthood, and it may not respond to treatment.  Pelvic pain.  Weight gain or obesity.  Patches of thickened and dark brown or black skin on the neck, arms, breasts, or thighs (acanthosis nigricans).  Excess hair growth on the face, chest, abdomen, or upper thighs (hirsutism). How is this diagnosed? This condition is diagnosed based on:  Your medical history.  A physical exam, including a pelvic exam. Your health care provider may look for areas of increased hair growth on your skin.  Tests, such as: ? Ultrasound. This may be used to examine the ovaries and the  lining of the uterus (endometrium) for cysts. ? Blood tests. These may be used to check levels of sugar (glucose), female hormone (testosterone), and female hormones (estrogen and progesterone) in your blood. How is this treated? There is no cure for PCOS, but treatment can help to manage symptoms and prevent more health problems from developing. Treatment varies depending on:  Your symptoms.  Whether you want to have a baby or whether you need birth control (contraception). Treatment may include nutrition and lifestyle changes along with:  Progesterone hormone to start a menstrual period.  Birth control pills to help you have regular menstrual periods.  Medicines to make you ovulate, if you want to get pregnant.  Medicine to reduce excessive hair growth.  Surgery, in severe cases.  This may involve making small holes in one or both of your ovaries. This decreases the amount of testosterone that your body produces. Follow these instructions at home:  Take over-the-counter and prescription medicines only as told by your health care provider.  Follow a healthy meal plan. This can help you reduce the effects of PCOS. ? Eat a healthy diet that includes lean proteins, complex carbohydrates, fresh fruits and vegetables, low-fat dairy products, and healthy fats. Make sure to eat enough fiber.  If you are overweight, lose weight as told by your health care provider. ? Losing 10% of your body weight may improve symptoms. ? Your health care provider can determine how much weight loss is best for you and can help you lose weight safely.  Keep all follow-up visits as told by your health care provider. This is important. Contact a health care provider if:  Your symptoms do not get better with medicine.  You develop new symptoms. This information is not intended to replace advice given to you by your health care provider. Make sure you discuss any questions you have with your health care  provider. Document Revised: 04/21/2017 Document Reviewed: 10/25/2015 Elsevier Patient Education  2020 [employer].

## 2019-12-23 NOTE — Telephone Encounter (Signed)
Call to patient. Per DPR, OK to leave message on voicemail.   Left voicemail requesting a return call to Hayley to review benefits and schedule recommended Pelvic ultrasound with Brook A. Silva, MD, FACOG 
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
## 2019-12-24 LAB — CYTOLOGY - PAP
Comment: NEGATIVE
Diagnosis: NEGATIVE
High risk HPV: NEGATIVE

## 2019-12-24 NOTE — Telephone Encounter (Signed)
Patient is returning call.  °

## 2019-12-24 NOTE — Telephone Encounter (Signed)
Spoke with patient regarding benefits for recommended ultrasound and endometrial biopsy. Patient is aware that ultrasound is transvaginal. Patient acknowledges understanding of information presented. Patient is aware of cancellation policy. Patient scheduled appointment for 12/26/2019 at 0800AM with Brook A. Quincy Simmonds, MD, [facility]. Encounter closed.

## 2019-12-26 ENCOUNTER — Other Ambulatory Visit (HOSPITAL_COMMUNITY)
Admission: RE | Admit: 2019-12-26 | Discharge: 2019-12-26 | Disposition: A | Discharge status: Home / Self Care | Payer: BC Managed Care – PPO | Source: Ambulatory Visit | Attending: Obstetrics and Gynecology | Admitting: Obstetrics and Gynecology

## 2019-12-26 ENCOUNTER — Ambulatory Visit: Payer: BC Managed Care – PPO | Admitting: Obstetrics and Gynecology

## 2019-12-26 ENCOUNTER — Other Ambulatory Visit: Payer: Self-pay

## 2019-12-26 ENCOUNTER — Encounter: Payer: Self-pay | Admitting: Obstetrics and Gynecology

## 2019-12-26 ENCOUNTER — Ambulatory Visit (INDEPENDENT_AMBULATORY_CARE_PROVIDER_SITE_OTHER): Payer: BC Managed Care – PPO

## 2019-12-26 VITALS — BP 122/70 | HR 84 | Ht 63.0 in | Wt 270.0 lb

## 2019-12-26 DIAGNOSIS — D219 Benign neoplasm of connective and other soft tissue, unspecified: Secondary | ICD-10-CM

## 2019-12-26 DIAGNOSIS — R7989 Other specified abnormal findings of blood chemistry: Secondary | ICD-10-CM | POA: Diagnosis not present

## 2019-12-26 DIAGNOSIS — N840 Polyp of corpus uteri: Secondary | ICD-10-CM | POA: Diagnosis not present

## 2019-12-26 DIAGNOSIS — N921 Excessive and frequent menstruation with irregular cycle: Secondary | ICD-10-CM

## 2019-12-26 DIAGNOSIS — L680 Hirsutism: Secondary | ICD-10-CM

## 2019-12-26 DIAGNOSIS — E782 Mixed hyperlipidemia: Secondary | ICD-10-CM

## 2019-12-26 LAB — SPECIMEN STATUS REPORT

## 2019-12-26 LAB — T3, FREE: T3, Free: 3.3 pg/mL (ref 2.0–4.4)

## 2019-12-26 LAB — T4, FREE: Free T4: 1.05 ng/dL (ref 0.82–1.77)
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
## 2019-12-26 NOTE — Patient Instructions (Signed)
Endometrial Biopsy, Care After This sheet gives you information about how to care for yourself after your procedure. Your health care provider may also give you more specific instructions. If you have problems or questions, contact your health care provider. What can I expect after the procedure? After the procedure, it is common to have:  Mild cramping.  A small amount of vaginal bleeding for a few days. This is normal. Follow these instructions at home:   Take over-the-counter and prescription medicines only as told by your health care provider.  Do not douche, use tampons, or have sexual intercourse until your health care provider approves.  Return to your normal activities as told by your health care provider. Ask your health care provider what activities are safe for you.  Follow instructions from your health care provider about any activity restrictions, such as restrictions on strenuous exercise or heavy lifting. Contact a health care provider if:  You have heavy bleeding, or bleed for longer than 2 days after the procedure.  You have bad smelling discharge from your vagina.  You have a fever or chills.  You have a burning sensation when urinating or you have difficulty urinating.  You have severe pain in your lower abdomen. Get help right away if:  You have severe cramps in your stomach or back.  You pass large blood clots.  Your bleeding increases.  You become weak or light-headed, or you pass out. Summary  After the procedure, it is common to have mild cramping and a small amount of vaginal bleeding for a few days.  Do not douche, use tampons, or have sexual intercourse until your health care provider approves.  Return to your normal activities as told by your health care provider. Ask your health care provider what activities are safe for you. This information is not intended to replace advice given to you by your health care provider. Make sure you discuss any  questions you have with your health care provider. Document Revised: 04/21/2017 Document Reviewed: 05/25/2016 Elsevier Patient Education  [place]. Levonorgestrel intrauterine device (IUD) What is this medicine? LEVONORGESTREL IUD (LEE voe nor jes trel) is a contraceptive (birth control) device. The device is placed inside the uterus by a healthcare professional. It is used to prevent pregnancy. This device can also be used to treat heavy bleeding that occurs during your period. This medicine may be used for other purposes; ask your health care provider or pharmacist if you have questions. COMMON BRAND NAME(S): Minette [place]land What should I tell my health care provider before I take this medicine? They need to know if you have any of these conditions:  abnormal Pap smear  cancer of the breast, uterus, or cervix  diabetes  endometritis  genital or pelvic infection now or in the past  have more than one sexual partner or your partner has more than one partner  heart disease  history of an ectopic or tubal pregnancy  immune system problems  IUD in place  liver disease or tumor  problems with blood clots or take blood-thinners  seizures  use intravenous drugs  uterus of unusual shape  vaginal bleeding that has not been explained  an unusual or allergic reaction to levonorgestrel, other hormones, silicone, or polyethylene, medicines, foods, dyes, or preservatives  pregnant or trying to get pregnant  breast-feeding How should I use this medicine? This device is placed inside the uterus by a health care professional. Talk to your pediatrician regarding the use  of this medicine in children. Special care may be needed. Overdosage: If you think you have taken too much of this medicine contact a poison control center or emergency room at once. NOTE: This medicine is only for you. Do not share this medicine with others. What if I miss a dose? This  does not apply. Depending on the brand of device you have inserted, the device will need to be replaced every 3 to 6 years if you wish to continue using this type of birth control. What may interact with this medicine? Do not take this medicine with any of the following medications:  amprenavir  bosentan  fosamprenavir This medicine may also interact with the following medications:  aprepitant  armodafinil  barbiturate medicines for inducing sleep or treating seizures  bexarotene  boceprevir  griseofulvin  medicines to treat seizures like carbamazepine, ethotoin, felbamate, oxcarbazepine, phenytoin, topiramate  modafinil  pioglitazone  rifabutin  rifampin  rifapentine  some medicines to treat HIV infection like atazanavir, efavirenz, indinavir, lopinavir, nelfinavir, tipranavir, ritonavir  St. John's wort  warfarin This list may not describe all possible interactions. Give your health care provider a list of all the medicines, herbs, non-prescription drugs, or dietary supplements you use. Also tell them if you smoke, drink alcohol, or use illegal drugs. Some items may interact with your medicine. What should I watch for while using this medicine? Visit your doctor or health care professional for regular check ups. See your doctor if you or your partner has sexual contact with others, becomes HIV positive, or gets a sexual transmitted disease. This product does not protect you against HIV infection (AIDS) or other sexually transmitted diseases. You can check the placement of the IUD yourself by reaching up to the top of your vagina with clean fingers to feel the threads. Do not pull on the threads. It is a good habit to check placement after each menstrual period. Call your doctor right away if you feel more of the IUD than just the threads or if you cannot feel the threads at all. The IUD may come out by itself. You may become pregnant if the device comes out. If you  notice that the IUD has come out use a backup birth control method like condoms and call your health care provider. Using tampons will not change the position of the IUD and are okay to use during your period. This IUD can be safely scanned with magnetic resonance imaging (MRI) only under specific conditions. Before you have an MRI, tell your healthcare provider that you have an IUD in place, and which type of IUD you have in place. What side effects may I notice from receiving this medicine? Side effects that you should report to your doctor or health care professional as soon as possible:  allergic reactions like skin rash, itching or hives, swelling of the face, lips, or tongue  fever, flu-like symptoms  genital sores  high blood pressure  no menstrual period for 6 weeks during use  pain, swelling, warmth in the leg  pelvic pain or tenderness  severe or sudden headache  signs of pregnancy  stomach cramping  sudden shortness of breath  trouble with balance, talking, or walking  unusual vaginal bleeding, discharge  yellowing of the eyes or skin Side effects that usually do not require medical attention (report to your doctor or health care professional if they continue or are bothersome):  acne  breast pain  change in sex drive or performance  changes  in weight  cramping, dizziness, or faintness while the device is being inserted  headache  irregular menstrual bleeding within first 3 to 6 months of use  nausea This list may not describe all possible side effects. Call your doctor for medical advice about side effects. You may report side effects to FDA at 1-800-FDA-1088. Where should I keep my medicine? This does not apply. NOTE: This sheet is a summary. It may not cover all possible information. If you have questions about this medicine, talk to your doctor, pharmacist, or health care provider.  2020 Elsevier/Gold Standard (2018-03-20 13:22:01)

## 2019-12-27 LAB — CBC
Hematocrit: 34.4 % (ref 34.0–46.6)
Hemoglobin: 11 g/dL — ABNORMAL LOW (ref 11.1–15.9)
MCH: 27.6 pg (ref 26.6–33.0)
MCHC: 32 g/dL (ref 31.5–35.7)
MCV: 86 fL (ref 79–97)
Platelets: 452 10*3/uL — ABNORMAL HIGH (ref 150–450)
RBC: 3.98 x10E6/uL (ref 3.77–5.28)
RDW: 15.3 % (ref 11.7–15.4)
WBC: 11.1 10*3/uL — ABNORMAL HIGH (ref 3.4–10.8)

## 2019-12-27 LAB — COMPREHENSIVE METABOLIC PANEL
ALT: 27 IU/L (ref 0–32)
AST: 22 IU/L (ref 0–40)
Albumin/Globulin Ratio: 1.4 (ref 1.2–2.2)
Albumin: 4.2 g/dL (ref 3.8–4.8)
Alkaline Phosphatase: 94 IU/L (ref 48–121)
BUN/Creatinine Ratio: 20 (ref 9–23)
BUN: 9 mg/dL (ref 6–20)
Bilirubin Total: <0.2 mg/dL (ref 0.0–1.2)
CO2: 24 mmol/L (ref 20–29)
Calcium: 9.3 mg/dL (ref 8.7–10.2)
Chloride: 100 mmol/L (ref 96–106)
Creatinine, Ser: 0.44 mg/dL — ABNORMAL LOW (ref 0.57–1.00)
GFR calc Af Amer: 149 mL/min/{1.73_m2} (ref >59)
GFR calc non Af Amer: 129 mL/min/{1.73_m2} (ref >59)
Globulin, Total: 3 g/dL (ref 1.5–4.5)
Glucose: 85 mg/dL (ref 65–99)
Potassium: 4.2 mmol/L (ref 3.5–5.2)
Sodium: 138 mmol/L (ref 134–144)
Total Protein: 7.2 g/dL (ref 6.0–8.5)

## 2019-12-27 LAB — LIPID PANEL
Chol/HDL Ratio: 5.5 ratio — ABNORMAL HIGH (ref 0.0–4.4)
Cholesterol, Total: 203 mg/dL — ABNORMAL HIGH (ref 100–199)
HDL: 37 mg/dL — ABNORMAL LOW (ref >39)
LDL Chol Calc (NIH): 131 mg/dL — ABNORMAL HIGH (ref 0–99)
Triglycerides: 197 mg/dL — ABNORMAL HIGH (ref 0–149)
VLDL Cholesterol Cal: 35 mg/dL (ref 5–40)

## 2019-12-27 LAB — TSH: TSH: 5.97 u[IU]/mL — ABNORMAL HIGH (ref 0.450–4.500)

## 2019-12-27 LAB — TESTOSTERONE, FREE, DIRECT
Testosterone, Free: 3.5 pg/mL (ref 0.0–4.2)
Testosterone, Total, LC/MS: 18.3 ng/dL (ref 10.0–55.0)

## 2019-12-27 LAB — FSH/LH
FSH: 6.2 m[IU]/mL
LH: 3.5 m[IU]/mL

## 2019-12-27 LAB — SURGICAL PATHOLOGY

## 2019-12-27 LAB — PROLACTIN: Prolactin: 13.3 ng/mL (ref 4.8–23.3)

## 2019-12-29 ENCOUNTER — Encounter: Payer: Self-pay | Admitting: Obstetrics and Gynecology

## 2019-12-30 ENCOUNTER — Telehealth: Payer: Self-pay

## 2019-12-30 DIAGNOSIS — D219 Benign neoplasm of connective and other soft tissue, unspecified: Secondary | ICD-10-CM

## 2019-12-30 NOTE — Telephone Encounter (Signed)
Left message for pt to return call to triage RN. 

## 2019-12-30 NOTE — Telephone Encounter (Signed)
-----   Message from Nunzio Cobbs, MD sent at 12/30/2019  1:04 PM EDT ----- Please let patient know that her endometrial biopsy is showing a benign endometrial polyp.  I recommend she have an office sonohysterogram (saline ultrasound) to define this further.  If it is microscopic only, then it does not need to be removed.  If it is visible with the sonohysterogram, then it will need removal in the operating room with hysteroscopy.   Her cortisol level is normal, and the DHEAS is also normal.   Her 17 OH-P is not back yet.   I have asked for a hemoglobin A1C to be added to her labs.

## 2019-12-31 ENCOUNTER — Telehealth: Payer: Self-pay | Admitting: Obstetrics and Gynecology

## 2019-12-31 NOTE — Telephone Encounter (Signed)
Call to patient. Per DPR, OK to leave message on voicemail.   Left voicemail requesting a return call to [facility] to review benefits for scheduled Sonohysterogram with Brook A. Quincy Simmonds, MD, Cherlynn June

## 2019-12-31 NOTE — Telephone Encounter (Signed)
Spoke with pt. Pt given results and recommendations. Pt agreeable and verbalized understanding. Pt scheduled for [facility] for 01/16/20 at 830 am. Pt verbalized understanding of date and time of appt. Cancellation policy reviewed. Pt aware of call for benefits.   Encounter closed.  [id number] for precert. Orders placed.

## 2019-12-31 NOTE — Telephone Encounter (Signed)
Spoke with patient regarding benefits for scheduled Sonohysterogram. Patient acknowledges understanding of information presented. Encounter closed. 

## 2019-12-31 NOTE — Telephone Encounter (Signed)
Patient returned a call to [name]. 

## 2020-01-03 LAB — CORTISOL: Cortisol: 20.1 ug/dL

## 2020-01-03 LAB — 17-HYDROXYPROGESTERONE: 17-Hydroxyprogesterone: 94 ng/dL

## 2020-01-03 LAB — DHEA-SULFATE: DHEA-SO4: 202 ug/dL (ref 57.3–279.2)

## 2020-01-06 ENCOUNTER — Telehealth: Payer: Self-pay

## 2020-01-06 DIAGNOSIS — L680 Hirsutism: Secondary | ICD-10-CM

## 2020-01-06 NOTE — Telephone Encounter (Signed)
-----   Message from Nunzio Cobbs, MD sent at 01/06/2020 12:47 PM EDT ----- Please contact patient with final hormone results.  Her cortisol is actually just above normal.  Her 17 OH-P level is also elevated for the time she was in her menstrual cycle when it was drawn.   We were unable to add a hemoglobin A1C to her labs already drawn.   I would like to have her see Dr. Cruzita Lederer, endocrinology for further evaluation and treatment of her hirsutism.

## 2020-01-06 NOTE — Telephone Encounter (Signed)
Spoke with pt. Pt given results and recommendations per Dr Quincy Simmonds. Pt agreeable to referral and verbalized understanding.  Referral placed for Dr Cruzita Lederer  Cc: Basilia Jumbo for referral.  Encounter closed.

## 2020-01-16 ENCOUNTER — Ambulatory Visit: Payer: BC Managed Care – PPO | Admitting: Obstetrics and Gynecology

## 2020-01-16 ENCOUNTER — Ambulatory Visit (INDEPENDENT_AMBULATORY_CARE_PROVIDER_SITE_OTHER): Payer: BC Managed Care – PPO

## 2020-01-16 ENCOUNTER — Encounter: Payer: Self-pay | Admitting: Obstetrics and Gynecology

## 2020-01-16 ENCOUNTER — Other Ambulatory Visit: Payer: Self-pay

## 2020-01-16 VITALS — BP 122/74 | HR 84 | Ht 63.0 in | Wt 270.0 lb

## 2020-01-16 DIAGNOSIS — E282 Polycystic ovarian syndrome: Secondary | ICD-10-CM

## 2020-01-16 DIAGNOSIS — D219 Benign neoplasm of connective and other soft tissue, unspecified: Secondary | ICD-10-CM | POA: Diagnosis not present

## 2020-01-16 DIAGNOSIS — L680 Hirsutism: Secondary | ICD-10-CM

## 2020-01-16 DIAGNOSIS — R7989 Other specified abnormal findings of blood chemistry: Secondary | ICD-10-CM | POA: Diagnosis not present

## 2020-01-16 DIAGNOSIS — N840 Polyp of corpus uteri: Secondary | ICD-10-CM | POA: Diagnosis not present

## 2020-01-16 DIAGNOSIS — Q51818 Other congenital malformations of uterus: Secondary | ICD-10-CM

## 2020-01-16 NOTE — Patient Instructions (Signed)
Spironolactone Oral Tablets What is this medicine? SPIRONOLACTONE (speer on oh LAK tone) is a diuretic. It helps you make more urine and to lose excess water from your body. This medicine is used to treat high blood pressure, and edema or swelling from heart, kidney, or liver disease. It is also used to treat patients who make too much aldosterone or have low potassium. This medicine may be used for other purposes; ask your health care provider or pharmacist if you have questions. COMMON BRAND NAME(S): Aldactone What should I tell my health care provider before I take this medicine? They need to know if you have any of these conditions:  high blood level of potassium  kidney disease or trouble making urine  liver disease  an unusual or allergic reaction to spironolactone, other medicines, foods, dyes, or preservatives  pregnant or trying to get pregnant  breast-feeding How should I use this medicine? Take this drug by mouth. Take it as directed on the prescription label at the same time every day. You can take it with or without food. You should always take it the same way. Keep taking it unless your health care provider tells you to stop. Talk to your health care provider about the use of this drug in children. Special care may be needed. Overdosage: If you think you have taken too much of this medicine contact a poison control center or emergency room at once. NOTE: This medicine is only for you. Do not share this medicine with others. What if I miss a dose? If you miss a dose, take it as soon as you can. If it is almost time for your next dose, take only that dose. Do not take double or extra doses. What may interact with this medicine? Do not take this medicine with any of the following medications:  cidofovir  eplerenone  tranylcypromine This medicine may also interact with the following medications:  aspirin  certain medicines for blood pressure or heart disease like  benazepril, lisinopril, losartan, valsartan  certain medicines that treat or prevent blood clots like heparin and enoxaparin  cholestyramine  cyclosporine  digoxin  lithium  medicines that relax muscles for surgery  NSAIDs, medicines for pain and inflammation, like ibuprofen or naproxen  other diuretics  potassium supplements  steroid medicines like prednisone or cortisone  trimethoprim This list may not describe all possible interactions. Give your health care provider a list of all the medicines, herbs, non-prescription drugs, or dietary supplements you use. Also tell them if you smoke, drink alcohol, or use illegal drugs. Some items may interact with your medicine. What should I watch for while using this medicine? Visit your doctor or health care professional for regular checks on your progress. Check your blood pressure as directed. Ask your doctor what your blood pressure should be, and when you should contact them. You may need to be on a special diet while taking this medicine. Ask your doctor. Also, ask how many glasses of fluid you need to drink a day. You must not get dehydrated. This medicine may make you feel confused, dizzy or lightheaded. Drinking alcohol and taking some medicines can make this worse. Do not drive, use machinery, or do anything that needs mental alertness until you know how this medicine affects you. Do not sit or stand up quickly. What side effects may I notice from receiving this medicine? Side effects that you should report to your doctor or health care professional as soon as possible:  allergic reactions  such as skin rash or itching, hives, swelling of the lips, mouth, tongue, or throat  black or tarry stools  fast, irregular heartbeat  fever  muscle pain, cramps  numbness, tingling in hands or feet  trouble breathing  trouble passing urine  unusual bleeding  unusually weak or tired Side effects that usually do not require medical  attention (report to your doctor or health care professional if they continue or are bothersome):  change in voice or hair growth  confusion  dizzy, drowsy  dry mouth, increased thirst  enlarged or tender breasts  headache  irregular menstrual periods  sexual difficulty, unable to have an erection  stomach upset This list may not describe all possible side effects. Call your doctor for medical advice about side effects. You may report side effects to FDA at 1-800-FDA-1088. Where should I keep my medicine? Keep out of the reach of children and pets. Store at room temperature between 20 and 25 degrees C (68 and 77 degrees F). Throw away any unused drug after the expiration date. NOTE: This sheet is a summary. It may not cover all possible information. If you have questions about this medicine, talk to your doctor, pharmacist, or health care provider.  2020 Elsevier/Gold Standard (2019-01-01 12:38:34)
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
## 2020-01-17 LAB — HEMOGLOBIN A1C
Est. average glucose Bld gHb Est-mCnc: 126 mg/dL
Hgb A1c MFr Bld: 6 % — ABNORMAL HIGH (ref 4.8–5.6)

## 2020-01-19 ENCOUNTER — Encounter: Payer: Self-pay | Admitting: Obstetrics and Gynecology

## 2020-01-19 ENCOUNTER — Other Ambulatory Visit: Payer: Self-pay | Admitting: Obstetrics and Gynecology

## 2020-01-19 ENCOUNTER — Telehealth: Payer: Self-pay | Admitting: Obstetrics and Gynecology

## 2020-01-19 DIAGNOSIS — R7309 Other abnormal glucose: Secondary | ICD-10-CM

## 2020-01-19 DIAGNOSIS — N840 Polyp of corpus uteri: Secondary | ICD-10-CM

## 2020-01-19 DIAGNOSIS — R7989 Other specified abnormal findings of blood chemistry: Secondary | ICD-10-CM

## 2020-01-19 DIAGNOSIS — L680 Hirsutism: Secondary | ICD-10-CM

## 2020-01-19 DIAGNOSIS — R7303 Prediabetes: Secondary | ICD-10-CM

## 2020-01-19 NOTE — Telephone Encounter (Signed)
Please precert and schedule hysteroscopy with Myosure resection of endometrial polyp, dilation and curettage.   Patient has menorrhagia with irregular menses and an endometrial polyp.

## 2020-01-20 NOTE — Telephone Encounter (Signed)
-----   Message from Nunzio Cobbs, MD sent at 01/19/2020  2:42 PM EDT ----- Please contact patient with results of testing.  She is prediabetic.   I would like her to see Dr. Dwyane Dee for multiple endocrinologic changes.  I will place this order.

## 2020-01-20 NOTE — Telephone Encounter (Signed)
Spoke with pt. Pt given results and recommendations per Dr Quincy Simmonds. Pt agreeable to see Dr Dwyane Dee. Referral placed by Dr Quincy Simmonds. Dr Cruzita Lederer referral cancelled.   Pt also needing precert and scheduling of hysteroscopy with Myosure resection of endometrial polyp, dilation and curettage.   Pt advised will be notified about this from the business office.  Cc: Hayley for precert and scheduling.   Encounter closed.

## 2020-01-21 ENCOUNTER — Telehealth: Payer: Self-pay | Admitting: Obstetrics and Gynecology

## 2020-01-21 NOTE — Telephone Encounter (Signed)
Spoke with patient regarding surgery benefits. Patient acknowledges understanding of information presented. Patient is aware that benefits presented are for professional benefits only. Patient is aware that once surgery is scheduled, the hospital will call with separate benefits. Patient is aware of surgery cancellation policy.  Patient is requesting surgery date of March 02, 2020. Patient is aware that once surgery is posted and confirmed, to expect a phone call from nursing supervisor, Verline Lema to review pre op and post op procedures and appointments. Patient is agreeable and ready to proceed with scheduling.

## 2020-01-22 NOTE — Telephone Encounter (Signed)
Hysteroscopy D&C myosure removal of endometrial polyp scheduled for 03/02/2020 at 0730 at [facility]. Pre op scheduled for 02/11/2020 at 8 am with Dr.Silva. COVID test schedule for 02/27/2020 at 9 am at [facility] location. Patient is aware of the need to quarantine after test until surgery. 2 week post op scheduled for 03/16/2020 at 4:30 pm with Dr.Silva. Surgery instructions reviewed and to be given to patient at her pre op appointment.  Routing to provider and will close encounter.
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
## 2020-02-11 ENCOUNTER — Ambulatory Visit (INDEPENDENT_AMBULATORY_CARE_PROVIDER_SITE_OTHER): Payer: BC Managed Care – PPO | Admitting: Obstetrics and Gynecology

## 2020-02-11 ENCOUNTER — Other Ambulatory Visit: Payer: Self-pay

## 2020-02-11 ENCOUNTER — Encounter: Payer: Self-pay | Admitting: Obstetrics and Gynecology

## 2020-02-11 VITALS — BP 128/70 | HR 84 | Ht 63.0 in | Wt 270.0 lb

## 2020-02-11 DIAGNOSIS — N921 Excessive and frequent menstruation with irregular cycle: Secondary | ICD-10-CM

## 2020-02-11 DIAGNOSIS — N840 Polyp of corpus uteri: Secondary | ICD-10-CM

## 2020-02-13 ENCOUNTER — Encounter: Payer: Self-pay | Admitting: Endocrinology

## 2020-02-13 ENCOUNTER — Ambulatory Visit: Payer: BC Managed Care – PPO | Admitting: Endocrinology

## 2020-02-13 ENCOUNTER — Other Ambulatory Visit: Payer: Self-pay

## 2020-02-13 VITALS — BP 144/97 | HR 90 | Ht 64.0 in | Wt 270.0 lb

## 2020-02-13 DIAGNOSIS — E039 Hypothyroidism, unspecified: Secondary | ICD-10-CM

## 2020-02-13 DIAGNOSIS — R7309 Other abnormal glucose: Secondary | ICD-10-CM

## 2020-02-13 DIAGNOSIS — N914 Secondary oligomenorrhea: Secondary | ICD-10-CM | POA: Diagnosis not present

## 2020-02-13 DIAGNOSIS — L659 Nonscarring hair loss, unspecified: Secondary | ICD-10-CM | POA: Diagnosis not present
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
## 2020-02-17 ENCOUNTER — Other Ambulatory Visit: Payer: Self-pay | Admitting: Endocrinology

## 2020-02-17 ENCOUNTER — Other Ambulatory Visit (INDEPENDENT_AMBULATORY_CARE_PROVIDER_SITE_OTHER): Payer: BC Managed Care – PPO

## 2020-02-17 ENCOUNTER — Other Ambulatory Visit: Payer: Self-pay

## 2020-02-17 DIAGNOSIS — N914 Secondary oligomenorrhea: Secondary | ICD-10-CM | POA: Diagnosis not present

## 2020-02-17 DIAGNOSIS — R7309 Other abnormal glucose: Secondary | ICD-10-CM

## 2020-02-17 DIAGNOSIS — L659 Nonscarring hair loss, unspecified: Secondary | ICD-10-CM

## 2020-02-17 DIAGNOSIS — E039 Hypothyroidism, unspecified: Secondary | ICD-10-CM

## 2020-02-17 LAB — GLUCOSE, RANDOM: Glucose, Bld: 95 mg/dL (ref 70–99)

## 2020-02-17 LAB — CORTISOL: Cortisol, Plasma: 9 ug/dL

## 2020-02-17 LAB — TSH: TSH: 8.1 u[IU]/mL — ABNORMAL HIGH (ref 0.35–4.50)

## 2020-02-17 LAB — T4, FREE: Free T4: 0.67 ng/dL (ref 0.60–1.60)

## 2020-02-18 ENCOUNTER — Other Ambulatory Visit: Payer: Self-pay | Admitting: Endocrinology

## 2020-02-20 ENCOUNTER — Other Ambulatory Visit: Payer: Self-pay | Admitting: Endocrinology

## 2020-02-20 DIAGNOSIS — E039 Hypothyroidism, unspecified: Secondary | ICD-10-CM

## 2020-02-20 MED ORDER — LEVOTHYROXINE SODIUM 25 MCG PO TABS: ORAL_TABLET | ORAL | 3 refills | Status: DC

## 2020-02-22 LAB — TESTOSTERONE, FREE, TOTAL, SHBG
Sex Hormone Binding: 15.6 nmol/L — ABNORMAL LOW (ref 24.6–122.0)
Testosterone, Free: 4.6 pg/mL — ABNORMAL HIGH (ref 0.0–4.2)
Testosterone: 42 ng/dL (ref 8–60)

## 2020-02-23 LAB — 17-HYDROXYPROGESTERONE: 17-Hydroxyprogesterone: 49 ng/dL

## 2020-02-24 NOTE — Telephone Encounter (Signed)
Patient returned call and [facility] [address]

## 2020-02-25 ENCOUNTER — Other Ambulatory Visit: Payer: Self-pay

## 2020-02-25 ENCOUNTER — Encounter (HOSPITAL_BASED_OUTPATIENT_CLINIC_OR_DEPARTMENT_OTHER): Payer: Self-pay | Admitting: Obstetrics and Gynecology
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
## 2020-02-25 NOTE — Progress Notes (Signed)
Spoke w/ via phone for pre-op interview---pt Lab needs dos---- urine preg              Lab results------has lab appt for cbc, bmet airway assessment 02-27-20 at 100 pm COVID test ------02-27-20 at 230 pm  Arrive at -------530 am 03-02-2020 NPO after MN NO Solid Food.  Clear liquids from MN until---430 am then npo Medications to take morning of surgery -----levothyroxine Diabetic medication -----n/a Patient Special Instructions -----none Pre-Op special Istructions -----none Patient verbalized understanding of instructions that were given at this phone interview. Patient denies shortness of breath, chest pain, fever, cough at this phone interview.

## 2020-02-27 ENCOUNTER — Other Ambulatory Visit (HOSPITAL_COMMUNITY)
Admission: RE | Admit: 2020-02-27 | Discharge: 2020-02-27 | Disposition: A | Discharge status: Home / Self Care | Payer: BC Managed Care – PPO | Source: Ambulatory Visit | Attending: Obstetrics and Gynecology | Admitting: Obstetrics and Gynecology

## 2020-02-27 ENCOUNTER — Encounter (HOSPITAL_COMMUNITY)
Admission: RE | Admit: 2020-02-27 | Discharge: 2020-02-27 | Disposition: A | Discharge status: Home / Self Care | Payer: BC Managed Care – PPO | Source: Ambulatory Visit | Attending: Obstetrics and Gynecology | Admitting: Obstetrics and Gynecology

## 2020-02-27 ENCOUNTER — Other Ambulatory Visit: Payer: Self-pay

## 2020-02-27 ENCOUNTER — Other Ambulatory Visit (HOSPITAL_COMMUNITY): Payer: Self-pay

## 2020-02-27 DIAGNOSIS — Z20822 Contact with and (suspected) exposure to covid-19: Secondary | ICD-10-CM | POA: Diagnosis not present

## 2020-02-27 DIAGNOSIS — Z01812 Encounter for preprocedural laboratory examination: Secondary | ICD-10-CM | POA: Insufficient documentation

## 2020-02-27 LAB — CBC
HCT: 40.6 % (ref 36.0–46.0)
Hemoglobin: 12.6 g/dL (ref 12.0–15.0)
MCH: 26 pg (ref 26.0–34.0)
MCHC: 31 g/dL (ref 30.0–36.0)
MCV: 83.7 fL (ref 80.0–100.0)
Platelets: 390 10*3/uL (ref 150–400)
RBC: 4.85 MIL/uL (ref 3.87–5.11)
RDW: 14.7 % (ref 11.5–15.5)
WBC: 8.1 10*3/uL (ref 4.0–10.5)
nRBC: 0 % (ref 0.0–0.2)

## 2020-02-27 LAB — BASIC METABOLIC PANEL
Anion gap: 9 (ref 5–15)
BUN: 9 mg/dL (ref 6–20)
CO2: 25 mmol/L (ref 22–32)
Calcium: 8.7 mg/dL — ABNORMAL LOW (ref 8.9–10.3)
Chloride: 102 mmol/L (ref 98–111)
Creatinine, Ser: 0.59 mg/dL (ref 0.44–1.00)
GFR calc non Af Amer: >60 mL/min (ref >60)
Glucose, Bld: 135 mg/dL — ABNORMAL HIGH (ref 70–99)
Potassium: 3.9 mmol/L (ref 3.5–5.1)
Sodium: 136 mmol/L (ref 135–145)

## 2020-02-27 LAB — SARS CORONAVIRUS 2 (TAT 6-24 HRS): SARS Coronavirus 2: NEGATIVE

## 2020-02-27 NOTE — Progress Notes (Signed)
Spoke with Patricia Rios zanetto pa pt meets [facility] guidelines

## 2020-03-01 NOTE — Anesthesia Preprocedure Evaluation (Addendum)
Anesthesia Evaluation  Patient identified by MRN, date of birth, ID band Patient awake    Reviewed: Allergy & Precautions, H&P , NPO status , Patient's Chart, lab work & pertinent test results  Airway Mallampati: III  TM Distance: >3 FB Neck ROM: Full    Dental no notable dental hx.    Pulmonary former smoker,    Pulmonary exam normal        Cardiovascular negative cardio ROS Normal cardiovascular exam     Neuro/Psych  Headaches, PSYCHIATRIC DISORDERS Anxiety Depression    GI/Hepatic negative GI ROS, Neg liver ROS,   Endo/Other  Hypothyroidism Morbid obesity  Renal/GU negative Renal ROS  negative genitourinary   Musculoskeletal negative musculoskeletal ROS (+)   Abdominal (+) + obese,   Peds  Hematology negative hematology ROS (+)   Anesthesia Other Findings   Reproductive/Obstetrics negative OB ROS                            Anesthesia Physical  Anesthesia Plan  ASA: III  Anesthesia Plan: General   Post-op Pain Management:    Induction: Intravenous  PONV Risk Score and Plan: 4 or greater and Ondansetron, Dexamethasone, Midazolam and Scopolamine patch - Pre-op  Airway Management Planned: LMA  Additional Equipment: None  Intra-op Plan:   Post-operative Plan: Extubation in OR  Informed Consent: I have reviewed the patients History and Physical, chart, labs and discussed the procedure including the risks, benefits and alternatives for the proposed anesthesia with the patient or authorized representative who has indicated his/her understanding and acceptance.     Dental advisory given  Plan Discussed with: CRNA  Anesthesia Plan Comments:       Anesthesia Quick Evaluation

## 2020-03-02 ENCOUNTER — Ambulatory Visit (HOSPITAL_BASED_OUTPATIENT_CLINIC_OR_DEPARTMENT_OTHER): Payer: BC Managed Care – PPO | Admitting: Physician Assistant

## 2020-03-02 ENCOUNTER — Ambulatory Visit (HOSPITAL_BASED_OUTPATIENT_CLINIC_OR_DEPARTMENT_OTHER)
Admission: RE | Admit: 2020-03-02 | Discharge: 2020-03-02 | Disposition: A | Discharge status: Home / Self Care | Payer: BC Managed Care – PPO | Attending: Obstetrics and Gynecology | Admitting: Obstetrics and Gynecology

## 2020-03-02 ENCOUNTER — Encounter (HOSPITAL_BASED_OUTPATIENT_CLINIC_OR_DEPARTMENT_OTHER)
Admission: RE | Disposition: A | Discharge status: Home / Self Care | Payer: Self-pay | Source: Home / Self Care | Attending: Obstetrics and Gynecology

## 2020-03-02 ENCOUNTER — Ambulatory Visit (HOSPITAL_BASED_OUTPATIENT_CLINIC_OR_DEPARTMENT_OTHER): Payer: BC Managed Care – PPO | Admitting: Anesthesiology

## 2020-03-02 ENCOUNTER — Encounter (HOSPITAL_BASED_OUTPATIENT_CLINIC_OR_DEPARTMENT_OTHER): Payer: Self-pay | Admitting: Obstetrics and Gynecology

## 2020-03-02 DIAGNOSIS — E282 Polycystic ovarian syndrome: Secondary | ICD-10-CM | POA: Insufficient documentation

## 2020-03-02 DIAGNOSIS — N840 Polyp of corpus uteri: Secondary | ICD-10-CM | POA: Diagnosis not present

## 2020-03-02 DIAGNOSIS — Z87891 Personal history of nicotine dependence: Secondary | ICD-10-CM | POA: Insufficient documentation

## 2020-03-02 DIAGNOSIS — N921 Excessive and frequent menstruation with irregular cycle: Secondary | ICD-10-CM | POA: Diagnosis not present

## 2020-03-02 DIAGNOSIS — F418 Other specified anxiety disorders: Secondary | ICD-10-CM | POA: Diagnosis not present

## 2020-03-02 DIAGNOSIS — E039 Hypothyroidism, unspecified: Secondary | ICD-10-CM | POA: Diagnosis not present

## 2020-03-02 DIAGNOSIS — D252 Subserosal leiomyoma of uterus: Secondary | ICD-10-CM | POA: Diagnosis not present

## 2020-03-02 DIAGNOSIS — K760 Fatty (change of) liver, not elsewhere classified: Secondary | ICD-10-CM | POA: Diagnosis not present

## 2020-03-02 DIAGNOSIS — Z6841 Body Mass Index (BMI) 40.0 and over, adult: Secondary | ICD-10-CM | POA: Insufficient documentation

## 2020-03-02 HISTORY — DX: Hypothyroidism, unspecified: E03.9

## 2020-03-02 HISTORY — PX: DILATATION & CURETTAGE/HYSTEROSCOPY WITH MYOSURE: SHX6511

## 2020-03-02 HISTORY — DX: Polycystic ovarian syndrome: E28.2

## 2020-03-02 LAB — POCT PREGNANCY, URINE: Preg Test, Ur: NEGATIVE

## 2020-03-02 SURGERY — DILATATION & CURETTAGE/HYSTEROSCOPY WITH MYOSURE
Anesthesia: General | Site: Vagina

## 2020-03-02 MED ORDER — IBUPROFEN 800 MG PO TABS: 800.0000 mg | ORAL_TABLET | Freq: Three times a day (TID) | ORAL | 0 refills | Status: DC | PRN

## 2020-03-02 MED ORDER — SCOPOLAMINE 1 MG/3DAYS TD PT72
MEDICATED_PATCH | TRANSDERMAL | Status: DC | PRN
  Administered 2020-03-02: 1 via TRANSDERMAL

## 2020-03-02 MED ORDER — FENTANYL CITRATE (PF) 100 MCG/2ML IJ SOLN
INTRAMUSCULAR | Status: AC
Start: 2020-03-02 — End: ?
  Filled 2020-03-02: qty 2

## 2020-03-02 MED ORDER — OXYCODONE HCL 5 MG PO TABS: 5.0000 mg | ORAL_TABLET | Freq: Once | ORAL | Status: DC | PRN

## 2020-03-02 MED ORDER — POVIDONE-IODINE 10 % EX SWAB: 2.0000 "application " | Freq: Once | CUTANEOUS | Status: DC

## 2020-03-02 MED ORDER — MIDAZOLAM HCL 5 MG/5ML IJ SOLN
INTRAMUSCULAR | Status: DC | PRN
  Administered 2020-03-02: 2 mg via INTRAVENOUS

## 2020-03-02 MED ORDER — LIDOCAINE HCL 1 % IJ SOLN
INTRAMUSCULAR | Status: AC
  Filled 2020-03-02: qty 20

## 2020-03-02 MED ORDER — OXYCODONE HCL 5 MG/5ML PO SOLN: 5.0000 mg | Freq: Once | ORAL | Status: DC | PRN

## 2020-03-02 MED ORDER — ACETAMINOPHEN 325 MG PO TABS: 325.0000 mg | ORAL_TABLET | ORAL | Status: DC | PRN

## 2020-03-02 MED ORDER — ONDANSETRON HCL 4 MG/2ML IJ SOLN
INTRAMUSCULAR | Status: AC
  Filled 2020-03-02: qty 2

## 2020-03-02 MED ORDER — ACETAMINOPHEN 160 MG/5ML PO SOLN: 325.0000 mg | ORAL | Status: DC | PRN

## 2020-03-02 MED ORDER — SCOPOLAMINE 1 MG/3DAYS TD PT72
MEDICATED_PATCH | TRANSDERMAL | Status: AC
  Filled 2020-03-02: qty 1

## 2020-03-02 MED ORDER — LIDOCAINE 2% (20 MG/ML) 5 ML SYRINGE
INTRAMUSCULAR | Status: AC
  Filled 2020-03-02: qty 5

## 2020-03-02 MED ORDER — FENTANYL CITRATE (PF) 100 MCG/2ML IJ SOLN
INTRAMUSCULAR | Status: DC | PRN
Start: 2020-03-02 — End: 2020-03-02
  Administered 2020-03-02: 100 ug via INTRAVENOUS

## 2020-03-02 MED ORDER — DEXAMETHASONE SODIUM PHOSPHATE 10 MG/ML IJ SOLN
INTRAMUSCULAR | Status: AC
  Filled 2020-03-02: qty 1

## 2020-03-02 MED ORDER — SILVER NITRATE-POT NITRATE 75-25 % EX MISC
CUTANEOUS | Status: AC
  Filled 2020-03-02: qty 10

## 2020-03-02 MED ORDER — KETOROLAC TROMETHAMINE 30 MG/ML IJ SOLN
INTRAMUSCULAR | Status: AC
  Filled 2020-03-02: qty 1

## 2020-03-02 MED ORDER — LACTATED RINGERS IV SOLN: INTRAVENOUS | Status: DC

## 2020-03-02 MED ORDER — LIDOCAINE 2% (20 MG/ML) 5 ML SYRINGE
INTRAMUSCULAR | Status: DC | PRN
  Administered 2020-03-02: 100 mg via INTRAVENOUS

## 2020-03-02 MED ORDER — ONDANSETRON HCL 4 MG/2ML IJ SOLN: 4.0000 mg | Freq: Once | INTRAMUSCULAR | Status: DC | PRN

## 2020-03-02 MED ORDER — LIDOCAINE HCL 1 % IJ SOLN
INTRAMUSCULAR | Status: DC | PRN
  Administered 2020-03-02: 10 mL

## 2020-03-02 MED ORDER — DEXAMETHASONE SODIUM PHOSPHATE 10 MG/ML IJ SOLN
INTRAMUSCULAR | Status: DC | PRN
  Administered 2020-03-02: 10 mg via INTRAVENOUS

## 2020-03-02 MED ORDER — MEPERIDINE HCL 25 MG/ML IJ SOLN: 6.2500 mg | INTRAMUSCULAR | Status: DC | PRN

## 2020-03-02 MED ORDER — MIDAZOLAM HCL 2 MG/2ML IJ SOLN
INTRAMUSCULAR | Status: AC
  Filled 2020-03-02: qty 2

## 2020-03-02 MED ORDER — KETOROLAC TROMETHAMINE 30 MG/ML IJ SOLN: 30.0000 mg | Freq: Once | INTRAMUSCULAR | Status: DC | PRN

## 2020-03-02 MED ORDER — SODIUM CHLORIDE 0.9 % IR SOLN
Status: DC | PRN
  Administered 2020-03-02: 3000 mL

## 2020-03-02 MED ORDER — PROPOFOL 10 MG/ML IV BOLUS
INTRAVENOUS | Status: AC
  Filled 2020-03-02: qty 40

## 2020-03-02 MED ORDER — PROPOFOL 10 MG/ML IV BOLUS
INTRAVENOUS | Status: DC | PRN
  Administered 2020-03-02: 200 mg via INTRAVENOUS

## 2020-03-02 MED ORDER — FENTANYL CITRATE (PF) 100 MCG/2ML IJ SOLN: 25.0000 ug | INTRAMUSCULAR | Status: DC | PRN

## 2020-03-02 SURGICAL SUPPLY — 16 items
CATH ROBINSON RED A/P 16FR (CATHETERS) ×2 IMPLANT
COVER WAND RF STERILE (DRAPES) ×2 IMPLANT
DEVICE MYOSURE LITE (MISCELLANEOUS) ×2 IMPLANT
DEVICE MYOSURE REACH (MISCELLANEOUS) IMPLANT
DILATOR CANAL MILEX (MISCELLANEOUS) IMPLANT
GAUZE 4X4 16PLY RFD (DISPOSABLE) ×2 IMPLANT
GLOVE BIO SURGEON STRL SZ 6.5 (GLOVE) ×2 IMPLANT
GOWN STRL REUS W/TWL LRG LVL3 (GOWN DISPOSABLE) ×2 IMPLANT
IV NS IRRIG 3000ML ARTHROMATIC (IV SOLUTION) ×2 IMPLANT
KIT PROCEDURE FLUENT (KITS) ×2 IMPLANT
MYOSURE XL FIBROID (MISCELLANEOUS)
PACK VAGINAL MINOR WOMEN LF (CUSTOM PROCEDURE TRAY) ×2 IMPLANT
PAD OB MATERNITY 4.3X12.25 (PERSONAL CARE ITEMS) ×2 IMPLANT
SEAL CERVICAL OMNI LOK (ABLATOR) IMPLANT
SEAL ROD LENS SCOPE MYOSURE (ABLATOR) ×2 IMPLANT
SYSTEM TISS REMOVAL MYOSURE XL (MISCELLANEOUS) IMPLANT

## 2020-03-02 NOTE — Op Note (Signed)
OPERATIVE REPORT   PREOPERATIVE DIAGNOSES:   Menorrhagia with irregular menses, endometrial mass.  POSTOPERATIVE DIAGNOSES:   Menorrhagia with irregular menses, polypoid endometrium.  PROCEDURE:  Hysteroscopy with dilation and curettage and Myosure resection of polypoid masses.  SURGEON:  Lenard Galloway, MD  ANESTHESIA:  LMA, paracervical block with 10 mL of 1% lidocaine.  IV FLUIDS:   700 cc LR  EBL:   5 cc  URINE OUTPUT:  20 cc  NORMAL SALINE DEFICIT:   696 cc  COMPLICATIONS:  None.  INDICATIONS FOR THE PROCEDURE:     The patient is a 40 year old Gravida 0, Para ) Caucasian female who presents with heavy and irregular menstruation.  Office biopsy showed an endometrial polyp, and sonohysterogram confirmed a 5 mm filling defect and findings of a 6 - 7 mm fundal septum.   A plan is now made to proceed with a hysteroscopy with dilation and curettage and myosure resection of endometrial mass; after risks, benefits and alternatives were reviewed.  FINDINGS:  Exam under anesthesia revealed a small anteverted uterus.  No adnexal masses were palpated. The uterus was sounded to 7 cm.   Hysteroscopy showed a dominant polypoid mass of the left posterior fundus, measuring 5 mm.  There were multiple smaller polypoid masses of the endometrial cavity in the cornual regions and left fundus.   There was a subtle small fundal septum appreciated. Endometrial currettings were moderate in amount.  SPECIMENS:  The endometrial polyp and endometrial curettings were sent to Pathology separately.  PROCEDURE IN DETAIL:  The patient was reidentified in the preoperative hold area.  She received TED hose and PAS stockings for DVT prophylaxis.  In the operating room, the patient was placed in the dorsal lithotomy position and then an LMA anesthetic was introduced.  The patient's lower abdomen, vagina and perineum were sterilely prepped and the  patient's bladder was catheterized of urine.  She was  sterilly draped.  An exam under anesthesia was performed.  A speculum was placed inside the vagina and a single-tooth tenaculum was placed on the anterior cervical lip.  A paracervical block was performed with a total of 10 mL of 1% lidocaine plain.  The uterus was sounded. The cervix was dilated to a #21 Pratt dilator.  The MyoSure hysteroscope was then inserted inside the uterine cavity under the continuous infusion of normal saline solution.  Findings are as noted above.  The MyoSure  Light was used to resect the polyps, which were sent to pathology.  The hysteroscope was removed.The cervix was further dilated to a #21 Pratt dilator.  The serrated  curette was introduced  into the uterine cavity and the endometrium was  curetted in all 4 quadrants.  A moderate amount of endometrial curettings was  obtained.  This specimen was sent to Pathology.  The single-tooth tenaculum which had been placed on the anterior cervical lip  was removed.  Hemostasis was good, and all of the vaginal instruments  were removed.  The patient was awakened and escorted to the recovery room in stable condition.  There were no complications to the procedure.  All needle, instrument  and sponge counts were correct.  Lenard Galloway, MD

## 2020-03-02 NOTE — Anesthesia Postprocedure Evaluation (Signed)
Anesthesia Post Note  Patient: Nature conservation officer  Procedure(s) Performed: DILATATION & CURETTAGE/HYSTEROSCOPY WITH MYOSURE/ENDOMETRIAL POLYP (N/A Vagina )     Patient location during evaluation: Phase II Anesthesia Type: General Level of consciousness: awake Pain management: pain level controlled Vital Signs Assessment: post-procedure vital signs reviewed and stable Respiratory status: spontaneous breathing Cardiovascular status: stable Postop Assessment: no apparent nausea or vomiting Anesthetic complications: no   No complications documented.  Last Vitals:  Vitals:   03/02/20 0830 03/02/20 0845  BP: 105/66 105/68  Pulse: 72 70  Resp: 13 14  Temp:    SpO2: 96% 95%    Last Pain:  Vitals:   03/02/20 0845  TempSrc:   PainSc: 0-No pain   Pain Goal: Patients Stated Pain Goal: 5 (03/02/20 0603)                 [name]

## 2020-03-02 NOTE — Progress Notes (Signed)
Update to History and Physical  No marked change in status since office pre-op visit.   Patient examined.   OK to proceed with surgery. 

## 2020-03-02 NOTE — Anesthesia Procedure Notes (Signed)
Procedure Name: LMA Insertion Date/Time: 03/02/2020 7:35 AM Performed by: Bonney Aid, CRNA Pre-anesthesia Checklist: Patient identified, Emergency Drugs available, Suction available and Patient being monitored Patient Re-evaluated:Patient Re-evaluated prior to induction Oxygen Delivery Method: Circle system utilized Preoxygenation: Pre-oxygenation with 100% oxygen Induction Type: IV induction Ventilation: Mask ventilation without difficulty LMA: LMA inserted LMA Size: 4.0 Number of attempts: 1 Airway Equipment and Method: Bite block Placement Confirmation: positive ETCO2 Tube secured with: Tape Dental Injury: Teeth and Oropharynx as per pre-operative assessment

## 2020-03-02 NOTE — Discharge Instructions (Signed)
DISCHARGE INSTRUCTIONS: HYSTEROSCOPY  The following instructions have been prepared to help you care for yourself upon your return home.  May Remove Scop patch on or before Thursday, March 05, 2020.  May take stool softner while taking narcotic pain medication to prevent constipation.  Drink plenty of water.  Personal hygiene: Marland Kitchen Use sanitary pads for vaginal drainage, not tampons. . Shower the day after your procedure. . NO tub baths, pools or Jacuzzis for 2-3 weeks. . Wipe front to back after using the bathroom.  Activity and limitations: . Do NOT drive or operate any equipment for 24 hours. The effects of anesthesia are still present and drowsiness may result. . Do NOT rest in bed all day. . Walking is encouraged. . Walk up and down stairs slowly. . You may resume your normal activity in one to two days or as indicated by your physician. Sexual activity: NO intercourse for at least 2 weeks after the procedure, or as indicated by your Doctor.  Diet: Eat a light meal as desired this evening. You may resume your usual diet tomorrow.  Return to Work: You may resume your work activities in one to two days or as indicated by Marine scientist.  What to expect after your surgery: Expect to have vaginal bleeding/discharge for 2-3 days and spotting for up to 10 days. It is not unusual to have soreness for up to 1-2 weeks. You may have a slight burning sensation when you urinate for the first day. Mild cramps may continue for a couple of days. You may have a regular period in 2-6 weeks.  Call your doctor for any of the following: . Excessive vaginal bleeding or clotting, saturating and changing one pad every hour. . Inability to urinate 6 hours after discharge from hospital. . Pain not relieved by pain medication. . Fever of 100.4 F or greater. . Unusual vaginal discharge or odor.   Post Anesthesia Home Care Instructions  Activity: Get plenty of rest for the remainder of the day. A  responsible individual must stay with you for 24 hours following the procedure.  For the next 24 hours, DO NOT: -Drive a car -Paediatric nurse -Drink alcoholic beverages -Take any medication unless instructed by your physician -Make any legal decisions or sign important papers.  Meals: Start with liquid foods such as gelatin or soup. Progress to regular foods as tolerated. Avoid greasy, spicy, heavy foods. If nausea and/or vomiting occur, drink only clear liquids until the nausea and/or vomiting subsides. Call your physician if vomiting continues.  Special Instructions/Symptoms: Your throat may feel dry or sore from the anesthesia or the breathing tube placed in your throat during surgery. If this causes discomfort, gargle with warm salt water. The discomfort should disappear within 24 hours.  If you had a scopolamine patch placed behind your ear for the management of post- operative nausea and/or vomiting:  1. The medication in the patch is effective for 72 hours, after which it should be removed.  Wrap patch in a tissue and discard in the trash. Wash hands thoroughly with soap and water. 2. You may remove the patch earlier than 72 hours if you experience unpleasant side effects which may include dry mouth, dizziness or visual disturbances. 3. Avoid touching the patch. Wash your hands with soap and water after contact with the patch.

## 2020-03-02 NOTE — Transfer of Care (Signed)
Immediate Anesthesia Transfer of Care Note  Patient: Patricia Rios  Procedure(s) Performed: DILATATION & CURETTAGE/HYSTEROSCOPY WITH MYOSURE/ENDOMETRIAL POLYP (N/A Vagina )  Patient Location: PACU  Anesthesia Type:General  Level of Consciousness: drowsy  Airway & Oxygen Therapy: Patient Spontanous Breathing and Patient connected to nasal cannula oxygen  Post-op Assessment: Report given to RN  Post vital signs: Reviewed and stable  Last Vitals:  Vitals Value Taken Time  BP 116/77   Temp    Pulse 79 03/02/20 0807  Resp 19 03/02/20 0807  SpO2 94 % 03/02/20 0807  Vitals shown include unvalidated device data.  Last Pain:  Vitals:   03/02/20 0603  TempSrc: Oral  PainSc: 0-No pain      Patients Stated Pain Goal: 5 (09/40/00 5056)  Complications: No complications documented.

## 2020-03-03 ENCOUNTER — Encounter (HOSPITAL_BASED_OUTPATIENT_CLINIC_OR_DEPARTMENT_OTHER): Payer: Self-pay | Admitting: Obstetrics and Gynecology

## 2020-03-03 LAB — SURGICAL PATHOLOGY

## 2020-03-16 ENCOUNTER — Encounter: Payer: Self-pay | Admitting: Obstetrics and Gynecology

## 2020-03-16 ENCOUNTER — Ambulatory Visit (INDEPENDENT_AMBULATORY_CARE_PROVIDER_SITE_OTHER): Payer: BC Managed Care – PPO | Admitting: Obstetrics and Gynecology

## 2020-03-16 ENCOUNTER — Other Ambulatory Visit: Payer: Self-pay

## 2020-03-16 VITALS — BP 110/70 | Ht 63.0 in | Wt 265.0 lb

## 2020-03-16 DIAGNOSIS — Z7689 Persons encountering health services in other specified circumstances: Secondary | ICD-10-CM

## 2020-03-16 DIAGNOSIS — Z9889 Other specified postprocedural states: Secondary | ICD-10-CM | POA: Diagnosis not present

## 2020-03-16 MED ORDER — NORETHINDRONE 0.35 MG PO TABS: 1.0000 | ORAL_TABLET | Freq: Every day | ORAL | 3 refills | Status: DC
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
## 2020-04-06 ENCOUNTER — Encounter: Payer: Self-pay | Admitting: Endocrinology

## 2020-04-06 ENCOUNTER — Other Ambulatory Visit: Payer: Self-pay

## 2020-04-06 ENCOUNTER — Ambulatory Visit: Payer: BC Managed Care – PPO | Admitting: Endocrinology

## 2020-04-06 VITALS — BP 124/78 | HR 87 | Ht 63.0 in | Wt 265.6 lb

## 2020-04-06 DIAGNOSIS — E282 Polycystic ovarian syndrome: Secondary | ICD-10-CM | POA: Diagnosis not present

## 2020-04-06 DIAGNOSIS — R7309 Other abnormal glucose: Secondary | ICD-10-CM | POA: Diagnosis not present

## 2020-04-06 DIAGNOSIS — E039 Hypothyroidism, unspecified: Secondary | ICD-10-CM | POA: Diagnosis not present

## 2020-04-06 LAB — T4, FREE: Free T4: 0.86 ng/dL (ref 0.60–1.60)

## 2020-04-06 LAB — TSH: TSH: 3.37 u[IU]/mL (ref 0.35–4.50)

## 2020-04-06 MED ORDER — QSYMIA 3.75-23 MG PO CP24: ORAL_CAPSULE | ORAL | 0 refills | Status: DC

## 2020-04-06 NOTE — Progress Notes (Signed)
Please call to let patient know that the thyroid results are normal, continue 25 mcg levothyroxine

## 2020-04-06 NOTE — Patient Instructions (Signed)
Qsymia lo dose 2 weeks
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
## 2020-04-13 ENCOUNTER — Telehealth: Payer: Self-pay | Admitting: Endocrinology

## 2020-04-13 MED ORDER — TOPIRAMATE 25 MG PO TABS: 25.0000 mg | ORAL_TABLET | Freq: Two times a day (BID) | ORAL | 2 refills | Status: AC

## 2020-04-13 MED ORDER — PHENTERMINE HCL 15 MG PO CAPS: 15.0000 mg | ORAL_CAPSULE | Freq: Every day | ORAL | 2 refills | Status: AC

## 2020-04-13 NOTE — Telephone Encounter (Signed)
Noted, detailed message left on patients vm

## 2020-04-13 NOTE — Telephone Encounter (Signed)
Qsymia is not affordable, sending prescription for phentermine 15 mg and Topamax 25 mg twice daily, please let her know that these are the same ingredients as Qsymia except not controlled release.

## 2020-04-27 NOTE — Telephone Encounter (Signed)
She said it's too soon for her insurance to approve and she was asking what can be done for that? Do I need to call Pharmacy or does she need to call her insurance?

## 2020-04-28 MED ORDER — LEVOTHYROXINE SODIUM 25 MCG PO TABS: ORAL_TABLET | ORAL | 2 refills | Status: DC

## 2020-04-28 MED ORDER — LEVOTHYROXINE SODIUM 25 MCG PO TABS: ORAL_TABLET | ORAL | 2 refills | Status: AC

## 2020-04-28 NOTE — Addendum Note (Signed)
Addended by: Amado Coe on: 04/28/2020 08:06 AM   Modules accepted: Orders

## 2020-05-28 ENCOUNTER — Other Ambulatory Visit: Payer: BC Managed Care – PPO

## 2020-06-03 ENCOUNTER — Ambulatory Visit: Payer: BC Managed Care – PPO | Admitting: Endocrinology

## 2020-07-26 ENCOUNTER — Other Ambulatory Visit: Payer: Self-pay | Admitting: Endocrinology

## 2020-08-26 ENCOUNTER — Other Ambulatory Visit: Payer: Self-pay | Admitting: Endocrinology

## 2020-08-26 NOTE — Telephone Encounter (Signed)
Please advise 

## 2020-08-26 NOTE — Telephone Encounter (Signed)
Please forward refill request to pt's primary care provider.   

## 2020-08-28 NOTE — Telephone Encounter (Signed)
Routed to PCP 

## 2020-08-30 NOTE — Telephone Encounter (Signed)
This prescription appears to have come from Dr. Dwyane Dee.   I am the patient's gynecologist and have not been prescribing her Topamax.

## 2021-01-22 ENCOUNTER — Other Ambulatory Visit: Payer: Self-pay | Admitting: Obstetrics and Gynecology

## 2021-03-30 ENCOUNTER — Other Ambulatory Visit: Payer: Self-pay | Admitting: Obstetrics and Gynecology
